# Patient Record
Sex: Female | Born: 1965 | Race: White | Hispanic: No | Smoking: Never smoker
Health system: Southern US, Community
[De-identification: ages and names within clinical notes are randomized; demographics above are authoritative.]

## PROBLEM LIST (undated history)

## (undated) DIAGNOSIS — R7303 Prediabetes: Secondary | ICD-10-CM

## (undated) DIAGNOSIS — K219 Gastro-esophageal reflux disease without esophagitis: Secondary | ICD-10-CM

## (undated) DIAGNOSIS — E7800 Pure hypercholesterolemia, unspecified: Secondary | ICD-10-CM

## (undated) DIAGNOSIS — N879 Dysplasia of cervix uteri, unspecified: Secondary | ICD-10-CM

## (undated) DIAGNOSIS — T8859XA Other complications of anesthesia, initial encounter: Secondary | ICD-10-CM

## (undated) DIAGNOSIS — J189 Pneumonia, unspecified organism: Secondary | ICD-10-CM

## (undated) HISTORY — DX: Gastro-esophageal reflux disease without esophagitis: K21.9

## (undated) HISTORY — PX: ACHILLES TENDON SURGERY: SHX542

## (undated) HISTORY — DX: Dysplasia of cervix uteri, unspecified: N87.9

---

## 2000-05-11 HISTORY — PX: LEEP: SHX91

## 2004-04-24 ENCOUNTER — Ambulatory Visit: Payer: Self-pay | Admitting: Unknown Physician Specialty

## 2005-05-05 ENCOUNTER — Ambulatory Visit: Payer: Self-pay | Admitting: Unknown Physician Specialty

## 2005-10-16 ENCOUNTER — Ambulatory Visit: Payer: Self-pay | Admitting: Orthopaedic Surgery

## 2006-05-06 ENCOUNTER — Ambulatory Visit: Payer: Self-pay | Admitting: Unknown Physician Specialty

## 2008-06-06 ENCOUNTER — Ambulatory Visit: Payer: Self-pay | Admitting: Unknown Physician Specialty

## 2009-06-26 ENCOUNTER — Ambulatory Visit: Payer: Self-pay | Admitting: Unknown Physician Specialty

## 2013-03-08 ENCOUNTER — Ambulatory Visit: Payer: Self-pay | Admitting: Obstetrics and Gynecology

## 2014-12-09 ENCOUNTER — Encounter: Payer: Self-pay | Admitting: Emergency Medicine

## 2014-12-09 ENCOUNTER — Emergency Department
Admission: EM | Admit: 2014-12-09 | Discharge: 2014-12-09 | Disposition: A | Discharge status: Home / Self Care | Payer: PRIVATE HEALTH INSURANCE | Attending: Emergency Medicine | Admitting: Emergency Medicine

## 2014-12-09 ENCOUNTER — Emergency Department: Payer: PRIVATE HEALTH INSURANCE

## 2014-12-09 ENCOUNTER — Other Ambulatory Visit: Payer: Self-pay

## 2014-12-09 DIAGNOSIS — R0789 Other chest pain: Secondary | ICD-10-CM | POA: Insufficient documentation

## 2014-12-09 DIAGNOSIS — R079 Chest pain, unspecified: Secondary | ICD-10-CM | POA: Diagnosis present

## 2014-12-09 HISTORY — DX: Pure hypercholesterolemia, unspecified: E78.00

## 2014-12-09 LAB — CBC
HCT: 41.3 % (ref 35.0–47.0)
Hemoglobin: 14.1 g/dL (ref 12.0–16.0)
MCH: 28.4 pg (ref 26.0–34.0)
MCHC: 34.1 g/dL (ref 32.0–36.0)
MCV: 83.4 fL (ref 80.0–100.0)
PLATELETS: 304 10*3/uL (ref 150–440)
RBC: 4.95 MIL/uL (ref 3.80–5.20)
RDW: 13.5 % (ref 11.5–14.5)
WBC: 8.4 10*3/uL (ref 3.6–11.0)

## 2014-12-09 LAB — COMPREHENSIVE METABOLIC PANEL
ALT: 38 U/L (ref 14–54)
AST: 31 U/L (ref 15–41)
Albumin: 4.1 g/dL (ref 3.5–5.0)
Alkaline Phosphatase: 60 U/L (ref 38–126)
Anion gap: 8 (ref 5–15)
BUN: 16 mg/dL (ref 6–20)
CO2: 27 mmol/L (ref 22–32)
Calcium: 9.7 mg/dL (ref 8.9–10.3)
Chloride: 106 mmol/L (ref 101–111)
Creatinine, Ser: 0.77 mg/dL (ref 0.44–1.00)
GFR calc Af Amer: >60 mL/min (ref >60)
GFR calc non Af Amer: >60 mL/min (ref >60)
GLUCOSE: 114 mg/dL — AB (ref 65–99)
Potassium: 3.8 mmol/L (ref 3.5–5.1)
Sodium: 141 mmol/L (ref 135–145)
Total Bilirubin: 0.5 mg/dL (ref 0.3–1.2)
Total Protein: 7.2 g/dL (ref 6.5–8.1)

## 2014-12-09 LAB — TROPONIN I: Troponin I: <0.03 ng/mL (ref <0.031)

## 2014-12-09 MED ORDER — RANITIDINE HCL 150 MG PO CAPS: 150.0000 mg | ORAL_CAPSULE | Freq: Two times a day (BID) | ORAL | Status: DC

## 2014-12-09 NOTE — ED Notes (Signed)
Patient transported to X-ray 

## 2014-12-09 NOTE — Discharge Instructions (Signed)

## 2014-12-09 NOTE — ED Notes (Signed)
Pt presents to ER alert and anxious. Pt states she worked out this morning and is now having CP that is radiating down her arm. Resp even and unlabored.

## 2014-12-09 NOTE — ED Provider Notes (Signed)
[facility] Emergency Department Provider Note  ____________________________________________  Time seen: 3:40 AM  I have reviewed the triage vital signs and the nursing notes.   HISTORY  Chief Complaint Chest Pain    HPI Mariah Mckay is a 49 y.o. female who complains of very gradual onset of chest pain that started around 10:00 PM last night. Spine constant until now, nonradiating, no diaphoresis nausea vomiting or lightheadedness or syncope. Is not exertional nor pleuritic. Over time has progressed to also having associated right arm paresthesia. The patient just started going to a gym for the first time yesterday morning. Immediately prior to the chest pain starting, she drank a [facility] and felt like she was getting stomach upset and acid reflux and indigestion from it. She tried taking 2 Tums     Past Medical History  Diagnosis Date  . Hypercholesteremia     There are no active problems to display for this patient.   Past Surgical History  Procedure Laterality Date  . Achilles tendon surgery      Current Outpatient Rx  Name  Route  Sig  Dispense  Refill  . ranitidine (ZANTAC) 150 MG capsule   Oral   Take 1 capsule (150 mg total) by mouth 2 (two) times daily.   28 capsule   0     Allergies Review of patient's allergies indicates no known allergies.  History reviewed. No pertinent family history.  Social History History  Substance Use Topics  . Smoking status: Never Smoker   . Smokeless tobacco: Not on file  . Alcohol Use: No    Review of Systems  Constitutional: No fever or chills. No weight changes Eyes:No blurry vision or double vision.  ENT: No sore throat. Cardiovascular: Positive chest pain. Respiratory: No dyspnea or cough. Gastrointestinal: Negative for abdominal pain, vomiting and diarrhea.  No BRBPR or melena. Genitourinary: Negative for dysuria, urinary retention, bloody urine, or difficulty  urinating. Musculoskeletal: Negative for back pain. No joint swelling or pain. Skin: Negative for rash. Neurological: Negative for headaches, focal weakness or numbness. Psychiatric:No anxiety or depression.   Endocrine:No hot/cold intolerance, changes in energy, or sleep difficulty.  10-point ROS otherwise negative.  ____________________________________________   PHYSICAL EXAM:  VITAL SIGNS: ED Triage Vitals  Enc Vitals Group     BP 12/09/14 0339 160/81 mmHg     Pulse Rate 12/09/14 0339 73     Resp 12/09/14 0339 20     Temp 12/09/14 0339 98.1 F (36.7 C)     Temp Source 12/09/14 0339 Oral     SpO2 12/09/14 0339 98 %     Weight --      Height --      Head Cir --      Peak Flow --      Pain Score 12/09/14 0339 7     Pain Loc --      Pain Edu? --      Excl. in GC? --      Constitutional: Alert and oriented. Well appearing and in no distress. Eyes: No scleral icterus. No conjunctival pallor. PERRL. EOMI ENT   Head: Normocephalic and atraumatic.   Nose: No congestion/rhinnorhea. No septal hematoma   Mouth/Throat: MMM, no pharyngeal erythema. No peritonsillar mass. No uvula shift.   Neck: No stridor. No SubQ emphysema. No meningismus. Hematological/Lymphatic/Immunilogical: No cervical lymphadenopathy. Cardiovascular: RRR. Normal and symmetric distal pulses are present in all extremities. No murmurs, rubs, or gallops. Respiratory: Normal respiratory effort without tachypnea  nor retractions. Breath sounds are clear and equal bilaterally. No wheezes/rales/rhonchi. Mild right midsternal chest wall tenderness Gastrointestinal: Soft and nontender. No distention. There is no CVA tenderness.  No rebound, rigidity, or guarding. Genitourinary: deferred Musculoskeletal: Nontender with normal range of motion in all extremities. No joint effusions.  No lower extremity tenderness.  No edema. Neurologic:   Normal speech and language.  CN 2-10 normal. Motor grossly  intact. No pronator drift.  Normal gait. No gross focal neurologic deficits are appreciated.  Skin:  Skin is warm, dry and intact. No rash noted.  No petechiae, purpura, or bullae. Psychiatric: Mood and affect are normal. Speech and behavior are normal. Patient exhibits appropriate insight and judgment.  ____________________________________________    LABS (pertinent positives/negatives) (all labs ordered are listed, but only abnormal results are displayed) Labs Reviewed  COMPREHENSIVE METABOLIC PANEL - Abnormal; Notable for the following:    Glucose, Bld 114 (*)    All other components within normal limits  CBC  TROPONIN I   ____________________________________________   EKG  Interpreted by me Sinus rhythm rate of 72 with 1 PVC on the 10 second strip, normal axis and intervals, normal QRS and ST segments. There are T-wave inversions in V2 and V3.  ____________________________________________    RADIOLOGY  Chest x-ray unremarkable  ____________________________________________   PROCEDURES  ____________________________________________   INITIAL IMPRESSION / ASSESSMENT AND PLAN / ED COURSE  Pertinent labs & imaging results that were available during my care of the patient were reviewed by me and considered in my medical decision making (see chart for details).  Patient well appearing no acute distress. Low suspicion for ACS PE TAD pneumothorax carditis mediastinitis pneumonia or sepsis. By history the patient most likely is having musculoskeletal chest wall pain due to a new exercise regimen or GERD related to [facility] that she drank immediately prior to the symptom onset. After roughly 6 hours of constant symptoms, this is highly unlikely to be cardiac in nature. A single troponin should be adequate to fully evaluate this. If negative, we'll discharge the patient home and have her use a trial of antacids.  ____________________________________________   FINAL  CLINICAL IMPRESSION(S) / ED DIAGNOSES  Final diagnoses:  Other chest pain      Sharman Cheek, MD 12/09/14 [id number]

## 2015-03-29 ENCOUNTER — Ambulatory Visit
Admission: RE | Admit: 2015-03-29 | Discharge: 2015-03-29 | Disposition: A | Discharge status: Home / Self Care | Payer: PRIVATE HEALTH INSURANCE | Source: Ambulatory Visit | Attending: Family Medicine | Admitting: Family Medicine

## 2015-03-29 ENCOUNTER — Other Ambulatory Visit: Payer: Self-pay | Admitting: Family Medicine

## 2015-03-29 DIAGNOSIS — M545 Low back pain: Secondary | ICD-10-CM | POA: Insufficient documentation

## 2015-03-29 DIAGNOSIS — M5136 Other intervertebral disc degeneration, lumbar region: Secondary | ICD-10-CM | POA: Insufficient documentation

## 2015-08-20 ENCOUNTER — Encounter: Payer: Self-pay | Admitting: Obstetrics and Gynecology

## 2015-08-20 ENCOUNTER — Ambulatory Visit (INDEPENDENT_AMBULATORY_CARE_PROVIDER_SITE_OTHER): Payer: Managed Care, Other (non HMO) | Admitting: Obstetrics and Gynecology

## 2015-08-20 VITALS — BP 118/84 | HR 90 | Ht 59.0 in | Wt 189.0 lb

## 2015-08-20 DIAGNOSIS — E669 Obesity, unspecified: Secondary | ICD-10-CM

## 2015-08-20 MED ORDER — PHENTERMINE HCL 37.5 MG PO TABS: 37.5000 mg | ORAL_TABLET | Freq: Every day | ORAL | Status: DC

## 2015-08-20 MED ORDER — CYANOCOBALAMIN 1000 MCG/ML IJ SOLN: 1000.0000 ug | Freq: Once | INTRAMUSCULAR | Status: DC

## 2015-08-20 NOTE — Progress Notes (Signed)
Subjective:  Luna GlasgowRebecca S Kincheloe is a 50 y.o. G0P0000 at Unknown being seen today for weight loss management- initial visit.  Patient reports General ROS: negative and reports previous weight loss attempts:   Risk factors include:  Back pain and sedentary job  Past treatment has included: small frequent feedings, nutritional supplement, vitamin supplement, social assistance, vitamin B-12 injections, appetite stimulant, exercise management and discontinuation of medication.  The following portions of the patient's history were reviewed and updated as appropriate: allergies, current medications, past family history, past medical history, past social history, past surgical history and problem list.   Objective:   Filed Vitals:   08/20/15 0923  BP: 118/84  Pulse: 90  Height: 4\' 11"  (1.499 m)  Weight: 189 lb (85.73 kg)    General:  Alert, oriented and cooperative. Patient is in no acute distress.  :   :   :   :   :   :   PE: Well groomed female in no current distress,   Mental Status: Normal mood and affect. Normal behavior. Normal judgment and thought content.   Current BMI: Body mass index is 38.15 kg/(m^2).   Assessment and Plan:  Obesity  1. Obesity (BMI 30-39.9)   Plan: low carb, High protein diet RX for adipex 37.5 mg daily and B12 1000mcg.ml monthly, to start now with first injection given at today's visit. Reviewed side-effects common to both medications and expected outcomes. Increase daily water intake to at least 8 bottle a day, every day.  Goal is to reduse weight by 10% by end of three months, and will re-evaluate then.  RTC in 4 weeks for Nurse visit to check weight & BP, and get next B12 injections.    Please refer to After Visit Summary for other counseling recommendations.    Shepherd Finnan N Hackney[place]ambley, CNM   Lelend Heinecke NIKE Manami Tutor, CNM      Consider the Low Glycemic Index Diet and 6 smaller meals daily .  This boosts your metabolism and regulates your  sugars:   Use the protein bar by Atkins because they have lots of fiber in them  Find the low carb flatbreads, tortillas and pita breads for sandwiches:  Joseph's makes a pita bread and a flat bread , available at [facility] [employer] and [employer]; Toufayah makes a low carb flatbread available at [employer] and HT that is 9 net carbs and 100 cal Mission makes a low carb whole wheat tortilla available at [employer],and most grocery stores with 6 net carbs and 210 cal  [place] yogurt can still have a lot of carbs .  Dannon Light N fit has 80 cal and 8 carbs

## 2015-09-17 ENCOUNTER — Ambulatory Visit (INDEPENDENT_AMBULATORY_CARE_PROVIDER_SITE_OTHER): Payer: Managed Care, Other (non HMO) | Admitting: Obstetrics and Gynecology

## 2015-09-17 VITALS — BP 118/88 | HR 90 | Ht 59.0 in | Wt 184.3 lb

## 2015-09-17 DIAGNOSIS — E669 Obesity, unspecified: Secondary | ICD-10-CM | POA: Diagnosis not present

## 2015-09-17 MED ORDER — CYANOCOBALAMIN 1000 MCG/ML IJ SOLN
1000.0000 ug | Freq: Once | INTRAMUSCULAR | Status: AC
  Administered 2015-09-17: 1000 ug via INTRAMUSCULAR

## 2015-09-17 NOTE — Progress Notes (Signed)
Patient ID: Mariah Mckay, female   DOB: 1966-04-19, 50 y.o.   MRN: [id number] Pt presents for weight, B/P, B-12 injection. No side effects of medication-Phentermine, or B-12.  Weight loss of  5 lbs. Encouraged eating healthy and exercise.

## 2015-10-18 ENCOUNTER — Ambulatory Visit: Payer: Managed Care, Other (non HMO)

## 2015-10-18 ENCOUNTER — Ambulatory Visit (INDEPENDENT_AMBULATORY_CARE_PROVIDER_SITE_OTHER): Payer: Managed Care, Other (non HMO) | Admitting: Obstetrics and Gynecology

## 2015-10-18 VITALS — BP 121/79 | HR 103 | Ht 59.0 in | Wt 183.2 lb

## 2015-10-18 DIAGNOSIS — R5383 Other fatigue: Secondary | ICD-10-CM | POA: Diagnosis not present

## 2015-10-18 DIAGNOSIS — E669 Obesity, unspecified: Secondary | ICD-10-CM | POA: Diagnosis not present

## 2015-10-18 MED ORDER — CYANOCOBALAMIN 1000 MCG/ML IJ SOLN
1000.0000 ug | Freq: Once | INTRAMUSCULAR | Status: AC
  Administered 2015-10-18: 1000 ug via INTRAMUSCULAR

## 2015-10-18 NOTE — Progress Notes (Signed)
Pt presents for weight, B/P, B-12 injection. No side effects of medication-Phentermine, or B-12.  Weight loss of 1 lbs. Encouraged eating healthy and exercise. Pt to follow up in August for reevaluation of weight management.

## 2015-10-18 NOTE — Patient Instructions (Signed)
Follow up in August for weight management eval

## 2015-12-20 ENCOUNTER — Encounter: Payer: Self-pay | Admitting: Obstetrics and Gynecology

## 2015-12-20 ENCOUNTER — Ambulatory Visit (INDEPENDENT_AMBULATORY_CARE_PROVIDER_SITE_OTHER): Payer: Managed Care, Other (non HMO) | Admitting: Obstetrics and Gynecology

## 2015-12-20 VITALS — BP 119/68 | HR 88 | Ht <= 58 in | Wt 187.6 lb

## 2015-12-20 DIAGNOSIS — E669 Obesity, unspecified: Secondary | ICD-10-CM | POA: Diagnosis not present

## 2015-12-20 MED ORDER — PHENTERMINE HCL 37.5 MG PO TABS: 37.5000 mg | ORAL_TABLET | Freq: Every day | ORAL | 2 refills | Status: DC

## 2015-12-20 NOTE — Progress Notes (Signed)
SUBJECTIVE:  50 y.o. here for follow-up weight loss visit, previously seen 4 weeks ago. Denies any concerns and feels like medication worked well, but didn't exercise as planned, desires another trial and will exercise as planned.  OBJECTIVE:  BP 119/68   Pulse 88   Ht 4\' 10"  (1.473 m)   Wt 187 lb 9.6 oz (85.1 kg)   BMI 39.21 kg/m   Body mass index is 39.21 kg/m. Patient appears well. ASSESSMENT:  Obesity- responding well to weight loss plan PLAN:  To continue with current medications. B12 102900mcg/ml injection given RTC in 4 weeks as planned  Melody [place] Corners[place]ambley, CNM

## 2016-01-07 ENCOUNTER — Ambulatory Visit: Payer: Self-pay | Admitting: Physician Assistant

## 2016-01-07 ENCOUNTER — Encounter: Payer: Self-pay | Admitting: Physician Assistant

## 2016-01-07 VITALS — BP 130/80 | HR 116 | Temp 99.5°F

## 2016-01-07 DIAGNOSIS — R509 Fever, unspecified: Secondary | ICD-10-CM

## 2016-01-07 DIAGNOSIS — N390 Urinary tract infection, site not specified: Secondary | ICD-10-CM

## 2016-01-07 LAB — POCT URINALYSIS DIPSTICK
Bilirubin, UA: NEGATIVE
Glucose, UA: NEGATIVE
Ketones, UA: NEGATIVE
Nitrite, UA: NEGATIVE
PH UA: 7
PROTEIN UA: NEGATIVE
SPEC GRAV UA: 1.015
UROBILINOGEN UA: 0.2

## 2016-01-07 NOTE — Progress Notes (Signed)
S: c/o swollen nodes behind her r ear and going down the back of her neck, states she had a headache on Thurs, but it went away by Friday, was on vacation, then noticed today she is having chills, nodes are more swollen, had a tick bite in April but none since then, no cough, congestion, v/d  O: vitals w low grade temp of 99.5, ENT wnl except for swollen posterior auricular nodes and swollen posterior cervical nodes, no lesions noted on scalp, throat wnl, neck supple no lymph, lungs c t a, cv rrr, ua +trace leuks  A: uti, lymphadenopathy  P: septra ds 1 po bid x 7d, if nodes not better in 1 week then will do labs and refer to ENT

## 2016-01-08 ENCOUNTER — Other Ambulatory Visit: Payer: Self-pay | Admitting: Physician Assistant

## 2016-01-08 MED ORDER — SULFAMETHOXAZOLE-TRIMETHOPRIM 800-160 MG PO TABS: 1.0000 | ORAL_TABLET | Freq: Two times a day (BID) | ORAL | 0 refills | Status: DC

## 2016-01-08 NOTE — Progress Notes (Signed)
S:  C/o uti sx for 2 days, burning, urgency, frequency, denies vaginal discharge, abdominal pain or flank pain:  Remainder ros neg  O:  Vitals wnl, nad, no cva tenderness, back nontender, lungs c t a,cv rrr, abd soft nontender, bs normal, n/v intact  A: uti  P: septra ds, increase water intake, add cranberry juice, return if not improving in 2 -3 days, return earlier if worsening, discussed pyelonephritis sx

## 2016-01-09 LAB — URINE CULTURE

## 2016-01-17 ENCOUNTER — Ambulatory Visit (INDEPENDENT_AMBULATORY_CARE_PROVIDER_SITE_OTHER): Payer: Managed Care, Other (non HMO) | Admitting: Obstetrics and Gynecology

## 2016-01-17 VITALS — BP 117/80 | HR 79 | Wt 177.0 lb

## 2016-01-17 DIAGNOSIS — E669 Obesity, unspecified: Secondary | ICD-10-CM

## 2016-01-17 NOTE — Progress Notes (Signed)
Patient ID: Mariah GlasgowRebecca S Bouldin, female   DOB: [birth year], 50 y.o.   MRN: [id number] Pt presents for weight, B/P, B-12 injection. No side effects of B-12 medication.  Weight loss of 10 lbs. Encouraged eating healthy and exercise.  Pt states phentermine keeps her awake at night, had headaches, and not eating enough. Has not taken the last couple of days and headache has gone away. I had suggested trying taking 1/2 of pill. Pt also states she is being charged for visits when coming for her weight management, $60.00 and I did tell pt that is the injection fee.  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 B-12 injection not given. I did tell her she needed her weight, b/p checked on monthly basis. Pt is exercising.  Please let me know if the 1/2 phentermine and the injection given by a friend. I told her I would call her if it wasn't.

## 2016-02-12 ENCOUNTER — Ambulatory Visit: Payer: Managed Care, Other (non HMO)

## 2016-03-18 ENCOUNTER — Encounter: Payer: Self-pay | Admitting: *Deleted

## 2016-03-20 ENCOUNTER — Other Ambulatory Visit: Payer: Self-pay | Admitting: Obstetrics and Gynecology

## 2016-03-20 ENCOUNTER — Ambulatory Visit (INDEPENDENT_AMBULATORY_CARE_PROVIDER_SITE_OTHER): Payer: Managed Care, Other (non HMO) | Admitting: Obstetrics and Gynecology

## 2016-03-20 ENCOUNTER — Encounter: Payer: Self-pay | Admitting: Obstetrics and Gynecology

## 2016-03-20 VITALS — BP 138/76 | HR 86 | Ht <= 58 in | Wt 180.8 lb

## 2016-03-20 DIAGNOSIS — Z01419 Encounter for gynecological examination (general) (routine) without abnormal findings: Secondary | ICD-10-CM

## 2016-03-20 DIAGNOSIS — E7800 Pure hypercholesterolemia, unspecified: Secondary | ICD-10-CM | POA: Insufficient documentation

## 2016-03-20 DIAGNOSIS — N952 Postmenopausal atrophic vaginitis: Secondary | ICD-10-CM | POA: Diagnosis not present

## 2016-03-20 DIAGNOSIS — R7303 Prediabetes: Secondary | ICD-10-CM | POA: Insufficient documentation

## 2016-03-20 DIAGNOSIS — E1169 Type 2 diabetes mellitus with other specified complication: Secondary | ICD-10-CM | POA: Insufficient documentation

## 2016-03-20 DIAGNOSIS — R58 Hemorrhage, not elsewhere classified: Secondary | ICD-10-CM | POA: Diagnosis not present

## 2016-03-20 MED ORDER — HYDROCORTISONE ACE-PRAMOXINE 1-1 % RE FOAM: 1.0000 | Freq: Two times a day (BID) | RECTAL | 2 refills | Status: DC

## 2016-03-20 MED ORDER — ESTRADIOL 0.1 MG/GM VA CREA: 2.0000 g | TOPICAL_CREAM | Freq: Every day | VAGINAL | 2 refills | Status: DC

## 2016-03-20 NOTE — Progress Notes (Signed)
Subjective:   Mariah Mckay is a 50 y.o. G0P0000 Caucasian female here for a routine well-woman exam.  No LMP recorded. Patient is postmenopausal.    Current complaints: pain with sex for last 6 months, burning with penetration PCP: [facility]       does desire labs, had labs obtained in Sept, and needs another draw,   Social History: Sexual: heterosexual Marital Status: divorced Living situation: alone Occupation: unknown occupation Tobacco/alcohol: no tobacco use Illicit drugs: no history of illicit drug use  The following portions of the patient's history were reviewed and updated as appropriate: allergies, current medications, past family history, past medical history, past social history, past surgical history and problem list.  Past Medical History Past Medical History:  Diagnosis Date  . Cervical dysplasia   . GERD (gastroesophageal reflux disease)   . Hypercholesteremia     Past Surgical History Past Surgical History:  Procedure Laterality Date  . ACHILLES TENDON SURGERY    . LEEP      Gynecologic History G0P0000  No LMP recorded. Patient is postmenopausal. Contraception: post menopausal status Last Pap: 2014. Results were: normal Last mammogram: 2014. Results were: normal   Obstetric History OB History  Gravida Para Term Preterm AB Living  0 0 0 0 0 0  SAB TAB Ectopic Multiple Live Births  0 0 0 0          Current Medications Current Outpatient Prescriptions on File Prior to Visit  Medication Sig Dispense Refill  . cyanocobalamin (,VITAMIN B-12,) 1000 MCG/ML injection Inject 1 mL (1,000 mcg total) into the muscle once. (Patient not taking: Reported on 03/20/2016) 10 mL 1  . phentermine (ADIPEX-P) 37.5 MG tablet Take 1 tablet (37.5 mg total) by mouth daily before breakfast. (Patient not taking: Reported on 03/20/2016) 30 tablet 2  . ranitidine (ZANTAC) 150 MG capsule Take 1 capsule (150 mg total) by mouth 2 (two) times daily. (Patient not taking: Reported  on 03/20/2016) 28 capsule 0  . sulfamethoxazole-trimethoprim (BACTRIM DS,SEPTRA DS) 800-160 MG tablet Take 1 tablet by mouth 2 (two) times daily. (Patient not taking: Reported on 03/20/2016) 14 tablet 0   No current facility-administered medications on file prior to visit.     Review of Systems Patient denies any headaches, blurred vision, shortness of breath, chest pain, abdominal pain, problems with bowel movements, urination, or intercourse.  Objective:  BP 138/76   Pulse 86   Ht 4\' 10"  (1.473 m)   Wt 180 lb 12.8 oz (82 kg)   BMI 37.79 kg/m  Physical Exam  General:  Well developed, well nourished, no acute distress. She is alert and oriented x3. Skin:  Warm and dry Neck:  Midline trachea, no thyromegaly or nodules Cardiovascular: Regular rate and rhythm, no murmur heard Lungs:  Effort normal, all lung fields clear to auscultation bilaterally Breasts:  No dominant palpable mass, retraction, or nipple discharge Abdomen:  Soft, non tender, no hepatosplenomegaly or masses Pelvic:  External genitalia is normal in appearance.  The vagina is atrophic. The cervix is bulbous, no CMT.  Thin prep pap is done with HR HPV cotesting. Uterus is felt to be normal size, shape, and contour.  No adnexal masses or tenderness noted. Extremities:  No swelling or varicosities noted Psych:  She has a normal mood and affect  Assessment:   Healthy well-woman exam Vaginal atrophy Internal hemorrhoid Obesity   Plan:  Labs repeated. Declined flu vaccine F/U 1 year  for AE, or sooner if needed Mammogram ordered or  sooner if problems Colonoscopy indicated  Sheryl Saintil Suzan NailerN Joceline Hinchcliff, CNM

## 2016-03-20 NOTE — Patient Instructions (Signed)
Preventive Care for Adults, Female A healthy lifestyle and preventive care can promote health and wellness. Preventive health guidelines for women include the following key practices.  A routine yearly physical is a good way to check with your health care provider about your health and preventive screening. It is a chance to share any concerns and updates on your health and to receive a thorough exam.  Visit your dentist for a routine exam and preventive care every 6 months. Brush your teeth twice a day and floss once a day. Good oral hygiene prevents tooth decay and gum disease.  The frequency of eye exams is based on your age, health, family medical history, use of contact lenses, and other factors. Follow your health care provider's recommendations for frequency of eye exams.  Eat a healthy diet. Foods like vegetables, fruits, whole grains, low-fat dairy products, and lean protein foods contain the nutrients you need without too many calories. Decrease your intake of foods high in solid fats, added sugars, and salt. Eat the right amount of calories for you.Get information about a proper diet from your health care provider, if necessary.  Regular physical exercise is one of the most important things you can do for your health. Most adults should get at least 150 minutes of moderate-intensity exercise (any activity that increases your heart rate and causes you to sweat) each week. In addition, most adults need muscle-strengthening exercises on 2 or more days a week.  Maintain a healthy weight. The body mass index (BMI) is a screening tool to identify possible weight problems. It provides an estimate of body fat based on height and weight. Your health care provider can find your BMI and can help you achieve or maintain a healthy weight.For adults 20 years and older:  A BMI below 18.5 is considered underweight.  A BMI of 18.5 to 24.9 is normal.  A BMI of 25 to 29.9 is considered  overweight.  A BMI of 30 and above is considered obese.  Maintain normal blood lipids and cholesterol levels by exercising and minimizing your intake of saturated fat. Eat a balanced diet with plenty of fruit and vegetables. Blood tests for lipids and cholesterol should begin at age 58 and be repeated every 5 years. If your lipid or cholesterol levels are high, you are over 50, or you are at high risk for heart disease, you may need your cholesterol levels checked more frequently.Ongoing high lipid and cholesterol levels should be treated with medicines if diet and exercise are not working.  If you smoke, find out from your health care provider how to quit. If you do not use tobacco, do not start.  Lung cancer screening is recommended for adults aged 32-80 years who are at high risk for developing lung cancer because of a history of smoking. A yearly low-dose CT scan of the lungs is recommended for people who have at least a 30-pack-year history of smoking and are a current smoker or have quit within the past 15 years. A pack year of smoking is smoking an average of 1 pack of cigarettes a day for 1 year (for example: 1 pack a day for 30 years or 2 packs a day for 15 years). Yearly screening should continue until the smoker has stopped smoking for at least 15 years. Yearly screening should be stopped for people who develop a health problem that would prevent them from having lung cancer treatment.  If you are pregnant, do not drink alcohol. If you are  breastfeeding, be very cautious about drinking alcohol. If you are not pregnant and choose to drink alcohol, do not have more than 1 drink per day. One drink is considered to be 12 ounces (355 mL) of beer, 5 ounces (148 mL) of wine, or 1.5 ounces (44 mL) of liquor.  Avoid use of street drugs. Do not share needles with anyone. Ask for help if you need support or instructions about stopping the use of drugs.  High blood pressure causes heart disease and  increases the risk of stroke. Your blood pressure should be checked at least every 1 to 2 years. Ongoing high blood pressure should be treated with medicines if weight loss and exercise do not work.  If you are 25-78 years old, ask your health care provider if you should take aspirin to prevent strokes.  Diabetes screening is done by taking a blood sample to check your blood glucose level after you have not eaten for a certain period of time (fasting). If you are not overweight and you do not have risk factors for diabetes, you should be screened once every 3 years starting at age 86. If you are overweight or obese and you are 3-87 years of age, you should be screened for diabetes every year as part of your cardiovascular risk assessment.  Breast cancer screening is essential preventive care for women. You should practice "breast self-awareness." This means understanding the normal appearance and feel of your breasts and may include breast self-examination. Any changes detected, no matter how small, should be reported to a health care provider. Women in their 66s and 30s should have a clinical breast exam (CBE) by a health care provider as part of a regular health exam every 1 to 3 years. After age 43, women should have a CBE every year. Starting at age 37, women should consider having a mammogram (breast X-ray test) every year. Women who have a family history of breast cancer should talk to their health care provider about genetic screening. Women at a high risk of breast cancer should talk to their health care providers about having an MRI and a mammogram every year.  Breast cancer gene (BRCA)-related cancer risk assessment is recommended for women who have family members with BRCA-related cancers. BRCA-related cancers include breast, ovarian, tubal, and peritoneal cancers. Having family members with these cancers may be associated with an increased risk for harmful changes (mutations) in the breast  cancer genes BRCA1 and BRCA2. Results of the assessment will determine the need for genetic counseling and BRCA1 and BRCA2 testing.  Your health care provider may recommend that you be screened regularly for cancer of the pelvic organs (ovaries, uterus, and vagina). This screening involves a pelvic examination, including checking for microscopic changes to the surface of your cervix (Pap test). You may be encouraged to have this screening done every 3 years, beginning at age 78.  For women ages 79-65, health care providers may recommend pelvic exams and Pap testing every 3 years, or they may recommend the Pap and pelvic exam, combined with testing for human papilloma virus (HPV), every 5 years. Some types of HPV increase your risk of cervical cancer. Testing for HPV may also be done on women of any age with unclear Pap test results.  Other health care providers may not recommend any screening for nonpregnant women who are considered low risk for pelvic cancer and who do not have symptoms. Ask your health care provider if a screening pelvic exam is right for  you.  If you have had past treatment for cervical cancer or a condition that could lead to cancer, you need Pap tests and screening for cancer for at least 20 years after your treatment. If Pap tests have been discontinued, your risk factors (such as having a new sexual partner) need to be reassessed to determine if screening should resume. Some women have medical problems that increase the chance of getting cervical cancer. In these cases, your health care provider may recommend more frequent screening and Pap tests.  Colorectal cancer can be detected and often prevented. Most routine colorectal cancer screening begins at the age of 2 years and continues through age 13 years. However, your health care provider may recommend screening at an earlier age if you have risk factors for colon cancer. On a yearly basis, your health care provider may provide  home test kits to check for hidden blood in the stool. Use of a small camera at the end of a tube, to directly examine the colon (sigmoidoscopy or colonoscopy), can detect the earliest forms of colorectal cancer. Talk to your health care provider about this at age 36, when routine screening begins. Direct exam of the colon should be repeated every 5-10 years through age 41 years, unless early forms of precancerous polyps or small growths are found.  People who are at an increased risk for hepatitis B should be screened for this virus. You are considered at high risk for hepatitis B if:  You were born in a country where hepatitis B occurs often. Talk with your health care provider about which countries are considered high risk.  Your parents were born in a high-risk country and you have not received a shot to protect against hepatitis B (hepatitis B vaccine).  You have HIV or AIDS.  You use needles to inject street drugs.  You live with, or have sex with, someone who has hepatitis B.  You get hemodialysis treatment.  You take certain medicines for conditions like cancer, organ transplantation, and autoimmune conditions.  Hepatitis C blood testing is recommended for all people born from 1 through 1965 and any individual with known risks for hepatitis C.  Practice safe sex. Use condoms and avoid high-risk sexual practices to reduce the spread of sexually transmitted infections (STIs). STIs include gonorrhea, chlamydia, syphilis, trichomonas, herpes, HPV, and human immunodeficiency virus (HIV). Herpes, HIV, and HPV are viral illnesses that have no cure. They can result in disability, cancer, and death.  You should be screened for sexually transmitted illnesses (STIs) including gonorrhea and chlamydia if:  You are sexually active and are younger than 24 years.  You are older than 24 years and your health care provider tells you that you are at risk for this type of infection.  Your sexual  activity has changed since you were last screened and you are at an increased risk for chlamydia or gonorrhea. Ask your health care provider if you are at risk.  If you are at risk of being infected with HIV, it is recommended that you take a prescription medicine daily to prevent HIV infection. This is called preexposure prophylaxis (PrEP). You are considered at risk if:  You are sexually active and do not regularly use condoms or know the HIV status of your partner(s).  You take drugs by injection.  You are sexually active with a partner who has HIV.  Talk with your health care provider about whether you are at high risk of being infected with HIV. If  you choose to begin PrEP, you should first be tested for HIV. You should then be tested every 3 months for as long as you are taking PrEP.  Osteoporosis is a disease in which the bones lose minerals and strength with aging. This can result in serious bone fractures or breaks. The risk of osteoporosis can be identified using a bone density scan. Women ages 1 years and over and women at risk for fractures or osteoporosis should discuss screening with their health care providers. Ask your health care provider whether you should take a calcium supplement or vitamin D to reduce the rate of osteoporosis.  Menopause can be associated with physical symptoms and risks. Hormone replacement therapy is available to decrease symptoms and risks. You should talk to your health care provider about whether hormone replacement therapy is right for you.  Use sunscreen. Apply sunscreen liberally and repeatedly throughout the day. You should seek shade when your shadow is shorter than you. Protect yourself by wearing long sleeves, pants, a wide-brimmed hat, and sunglasses year round, whenever you are outdoors.  Once a month, do a whole body skin exam, using a mirror to look at the skin on your back. Tell your health care provider of new moles, moles that have irregular  borders, moles that are larger than a pencil eraser, or moles that have changed in shape or color.  Stay current with required vaccines (immunizations).  Influenza vaccine. All adults should be immunized every year.  Tetanus, diphtheria, and acellular pertussis (Td, Tdap) vaccine. Pregnant women should receive 1 dose of Tdap vaccine during each pregnancy. The dose should be obtained regardless of the length of time since the last dose. Immunization is preferred during the 27th-36th week of gestation. An adult who has not previously received Tdap or who does not know her vaccine status should receive 1 dose of Tdap. This initial dose should be followed by tetanus and diphtheria toxoids (Td) booster doses every 10 years. Adults with an unknown or incomplete history of completing a 3-dose immunization series with Td-containing vaccines should begin or complete a primary immunization series including a Tdap dose. Adults should receive a Td booster every 10 years.  Varicella vaccine. An adult without evidence of immunity to varicella should receive 2 doses or a second dose if she has previously received 1 dose. Pregnant females who do not have evidence of immunity should receive the first dose after pregnancy. This first dose should be obtained before leaving the health care facility. The second dose should be obtained 4-8 weeks after the first dose.  Human papillomavirus (HPV) vaccine. Females aged 13-26 years who have not received the vaccine previously should obtain the 3-dose series. The vaccine is not recommended for use in pregnant females. However, pregnancy testing is not needed before receiving a dose. If a female is found to be pregnant after receiving a dose, no treatment is needed. In that case, the remaining doses should be delayed until after the pregnancy. Immunization is recommended for any person with an immunocompromised condition through the age of 24 years if she did not get any or all doses  earlier. During the 3-dose series, the second dose should be obtained 4-8 weeks after the first dose. The third dose should be obtained 24 weeks after the first dose and 16 weeks after the second dose.  Zoster vaccine. One dose is recommended for adults aged 97 years or older unless certain conditions are present.  Measles, mumps, and rubella (MMR) vaccine. Adults born  before 1957 generally are considered immune to measles and mumps. Adults born in [birth year] or later should have 1 or more doses of MMR vaccine unless there is a contraindication to the vaccine or there is laboratory evidence of immunity to each of the three diseases. A routine second dose of MMR vaccine should be obtained at least 28 days after the first dose for students attending postsecondary schools, health care workers, or international travelers. People who received inactivated measles vaccine or an unknown type of measles vaccine during 1963-1967 should receive 2 doses of MMR vaccine. People who received inactivated mumps vaccine or an unknown type of mumps vaccine before 1979 and are at high risk for mumps infection should consider immunization with 2 doses of MMR vaccine. For females of childbearing age, rubella immunity should be determined. If there is no evidence of immunity, females who are not pregnant should be vaccinated. If there is no evidence of immunity, females who are pregnant should delay immunization until after pregnancy. Unvaccinated health care workers born before [birth year] who lack laboratory evidence of measles, mumps, or rubella immunity or laboratory confirmation of disease should consider measles and mumps immunization with 2 doses of MMR vaccine or rubella immunization with 1 dose of MMR vaccine.  Pneumococcal 13-valent conjugate (PCV13) vaccine. When indicated, a person who is uncertain of his immunization history and has no record of immunization should receive the PCV13 vaccine. All adults 61 years of age and older  should receive this vaccine. An adult aged 92 years or older who has certain medical conditions and has not been previously immunized should receive 1 dose of PCV13 vaccine. This PCV13 should be followed with a dose of pneumococcal polysaccharide (PPSV23) vaccine. Adults who are at high risk for pneumococcal disease should obtain the PPSV23 vaccine at least 8 weeks after the dose of PCV13 vaccine. Adults older than 50 years of age who have normal immune system function should obtain the PPSV23 vaccine dose at least 1 year after the dose of PCV13 vaccine.  Pneumococcal polysaccharide (PPSV23) vaccine. When PCV13 is also indicated, PCV13 should be obtained first. All adults aged 2 years and older should be immunized. An adult younger than age 30 years who has certain medical conditions should be immunized. Any person who resides in a nursing home or long-term care facility should be immunized. An adult smoker should be immunized. People with an immunocompromised condition and certain other conditions should receive both PCV13 and PPSV23 vaccines. People with human immunodeficiency virus (HIV) infection should be immunized as soon as possible after diagnosis. Immunization during chemotherapy or radiation therapy should be avoided. Routine use of PPSV23 vaccine is not recommended for American Indians, [place] Natives, or people younger than 65 years unless there are medical conditions that require PPSV23 vaccine. When indicated, people who have unknown immunization and have no record of immunization should receive PPSV23 vaccine. One-time revaccination 5 years after the first dose of PPSV23 is recommended for people aged 19-64 years who have chronic kidney failure, nephrotic syndrome, asplenia, or immunocompromised conditions. People who received 1-2 doses of PPSV23 before age 44 years should receive another dose of PPSV23 vaccine at age 83 years or later if at least 5 years have passed since the previous dose. Doses  of PPSV23 are not needed for people immunized with PPSV23 at or after age 20 years.  Meningococcal vaccine. Adults with asplenia or persistent complement component deficiencies should receive 2 doses of quadrivalent meningococcal conjugate (MenACWY-D) vaccine. The doses should be obtained  at least 2 months apart. Microbiologists working with certain meningococcal bacteria, [employer] recruits, people at risk during an outbreak, and people who travel to or live in countries with a high rate of meningitis should be immunized. A first-year college student up through age 68 years who is living in a residence hall should receive a dose if she did not receive a dose on or after her 16th birthday. Adults who have certain high-risk conditions should receive one or more doses of vaccine.  Hepatitis A vaccine. Adults who wish to be protected from this disease, have certain high-risk conditions, work with hepatitis A-infected animals, work in hepatitis A research labs, or travel to or work in countries with a high rate of hepatitis A should be immunized. Adults who were previously unvaccinated and who anticipate close contact with an international adoptee during the first 60 days after arrival in the [place] from a country with a high rate of hepatitis A should be immunized.  Hepatitis B vaccine. Adults who wish to be protected from this disease, have certain high-risk conditions, may be exposed to blood or other infectious body fluids, are household contacts or sex partners of hepatitis B positive people, are clients or workers in certain care facilities, or travel to or work in countries with a high rate of hepatitis B should be immunized.  Haemophilus influenzae type b (Hib) vaccine. A previously unvaccinated person with asplenia or sickle cell disease or having a scheduled splenectomy should receive 1 dose of Hib vaccine. Regardless of previous immunization, a recipient of a hematopoietic stem cell transplant  should receive a 3-dose series 6-12 months after her successful transplant. Hib vaccine is not recommended for adults with HIV infection. Preventive Services / Frequency Ages 66 to 56 years  Blood pressure check.** / Every 3-5 years.  Lipid and cholesterol check.** / Every 5 years beginning at age 36.  Clinical breast exam.** / Every 3 years for women in their 34s and 75s.  BRCA-related cancer risk assessment.** / For women who have family members with a BRCA-related cancer (breast, ovarian, tubal, or peritoneal cancers).  Pap test.** / Every 2 years from ages 104 through 52. Every 3 years starting at age 52 through age 31 or 74 with a history of 3 consecutive normal Pap tests.  HPV screening.** / Every 3 years from ages 21 through ages 59 to 60 with a history of 3 consecutive normal Pap tests.  Hepatitis C blood test.** / For any individual with known risks for hepatitis C.  Skin self-exam. / Monthly.  Influenza vaccine. / Every year.  Tetanus, diphtheria, and acellular pertussis (Tdap, Td) vaccine.** / Consult your health care provider. Pregnant women should receive 1 dose of Tdap vaccine during each pregnancy. 1 dose of Td every 10 years.  Varicella vaccine.** / Consult your health care provider. Pregnant females who do not have evidence of immunity should receive the first dose after pregnancy.  HPV vaccine. / 3 doses over 6 months, if 88 and younger. The vaccine is not recommended for use in pregnant females. However, pregnancy testing is not needed before receiving a dose.  Measles, mumps, rubella (MMR) vaccine.** / You need at least 1 dose of MMR if you were born in 1957 or later. You may also need a 2nd dose. For females of childbearing age, rubella immunity should be determined. If there is no evidence of immunity, females who are not pregnant should be vaccinated. If there is no evidence of immunity, females who are  pregnant should delay immunization until after  pregnancy.  Pneumococcal 13-valent conjugate (PCV13) vaccine.** / Consult your health care provider.  Pneumococcal polysaccharide (PPSV23) vaccine.** / 1 to 2 doses if you smoke cigarettes or if you have certain conditions.  Meningococcal vaccine.** / 1 dose if you are age 87 to 44 years and a Market researcher living in a residence hall, or have one of several medical conditions, you need to get vaccinated against meningococcal disease. You may also need additional booster doses.  Hepatitis A vaccine.** / Consult your health care provider.  Hepatitis B vaccine.** / Consult your health care provider.  Haemophilus influenzae type b (Hib) vaccine.** / Consult your health care provider. Ages 86 to 38 years  Blood pressure check.** / Every year.  Lipid and cholesterol check.** / Every 5 years beginning at age 49 years.  Lung cancer screening. / Every year if you are aged 71-80 years and have a 30-pack-year history of smoking and currently smoke or have quit within the past 15 years. Yearly screening is stopped once you have quit smoking for at least 15 years or develop a health problem that would prevent you from having lung cancer treatment.  Clinical breast exam.** / Every year after age 51 years.  BRCA-related cancer risk assessment.** / For women who have family members with a BRCA-related cancer (breast, ovarian, tubal, or peritoneal cancers).  Mammogram.** / Every year beginning at age 18 years and continuing for as long as you are in good health. Consult with your health care provider.  Pap test.** / Every 3 years starting at age 63 years through age 37 or 57 years with a history of 3 consecutive normal Pap tests.  HPV screening.** / Every 3 years from ages 41 years through ages 76 to 23 years with a history of 3 consecutive normal Pap tests.  Fecal occult blood test (FOBT) of stool. / Every year beginning at age 36 years and continuing until age 51 years. You may not need  to do this test if you get a colonoscopy every 10 years.  Flexible sigmoidoscopy or colonoscopy.** / Every 5 years for a flexible sigmoidoscopy or every 10 years for a colonoscopy beginning at age 36 years and continuing until age 35 years.  Hepatitis C blood test.** / For all people born from [birth year] through 1965 and any individual with known risks for hepatitis C.  Skin self-exam. / Monthly.  Influenza vaccine. / Every year.  Tetanus, diphtheria, and acellular pertussis (Tdap/Td) vaccine.** / Consult your health care provider. Pregnant women should receive 1 dose of Tdap vaccine during each pregnancy. 1 dose of Td every 10 years.  Varicella vaccine.** / Consult your health care provider. Pregnant females who do not have evidence of immunity should receive the first dose after pregnancy.  Zoster vaccine.** / 1 dose for adults aged 73 years or older.  Measles, mumps, rubella (MMR) vaccine.** / You need at least 1 dose of MMR if you were born in 1957 or later. You may also need a second dose. For females of childbearing age, rubella immunity should be determined. If there is no evidence of immunity, females who are not pregnant should be vaccinated. If there is no evidence of immunity, females who are pregnant should delay immunization until after pregnancy.  Pneumococcal 13-valent conjugate (PCV13) vaccine.** / Consult your health care provider.  Pneumococcal polysaccharide (PPSV23) vaccine.** / 1 to 2 doses if you smoke cigarettes or if you have certain conditions.  Meningococcal vaccine.** /  Consult your health care provider.  Hepatitis A vaccine.** / Consult your health care provider.  Hepatitis B vaccine.** / Consult your health care provider.  Haemophilus influenzae type b (Hib) vaccine.** / Consult your health care provider. Ages 80 years and over  Blood pressure check.** / Every year.  Lipid and cholesterol check.** / Every 5 years beginning at age 62 years.  Lung cancer  screening. / Every year if you are aged 32-80 years and have a 30-pack-year history of smoking and currently smoke or have quit within the past 15 years. Yearly screening is stopped once you have quit smoking for at least 15 years or develop a health problem that would prevent you from having lung cancer treatment.  Clinical breast exam.** / Every year after age 61 years.  BRCA-related cancer risk assessment.** / For women who have family members with a BRCA-related cancer (breast, ovarian, tubal, or peritoneal cancers).  Mammogram.** / Every year beginning at age 39 years and continuing for as long as you are in good health. Consult with your health care provider.  Pap test.** / Every 3 years starting at age 85 years through age 74 or 72 years with 3 consecutive normal Pap tests. Testing can be stopped between 65 and 70 years with 3 consecutive normal Pap tests and no abnormal Pap or HPV tests in the past 10 years.  HPV screening.** / Every 3 years from ages 55 years through ages 67 or 77 years with a history of 3 consecutive normal Pap tests. Testing can be stopped between 65 and 70 years with 3 consecutive normal Pap tests and no abnormal Pap or HPV tests in the past 10 years.  Fecal occult blood test (FOBT) of stool. / Every year beginning at age 81 years and continuing until age 22 years. You may not need to do this test if you get a colonoscopy every 10 years.  Flexible sigmoidoscopy or colonoscopy.** / Every 5 years for a flexible sigmoidoscopy or every 10 years for a colonoscopy beginning at age 67 years and continuing until age 22 years.  Hepatitis C blood test.** / For all people born from [birth year] through 1965 and any individual with known risks for hepatitis C.  Osteoporosis screening.** / A one-time screening for women ages 8 years and over and women at risk for fractures or osteoporosis.  Skin self-exam. / Monthly.  Influenza vaccine. / Every year.  Tetanus, diphtheria, and  acellular pertussis (Tdap/Td) vaccine.** / 1 dose of Td every 10 years.  Varicella vaccine.** / Consult your health care provider.  Zoster vaccine.** / 1 dose for adults aged 56 years or older.  Pneumococcal 13-valent conjugate (PCV13) vaccine.** / Consult your health care provider.  Pneumococcal polysaccharide (PPSV23) vaccine.** / 1 dose for all adults aged 15 years and older.  Meningococcal vaccine.** / Consult your health care provider.  Hepatitis A vaccine.** / Consult your health care provider.  Hepatitis B vaccine.** / Consult your health care provider.  Haemophilus influenzae type b (Hib) vaccine.** / Consult your health care provider. ** Family history and personal history of risk and conditions may change your health care provider's recommendations.   This information is not intended to replace advice given to you by your health care provider. Make sure you discuss any questions you have with your health care provider.   Document Released: 06/23/2001 Document Revised: 05/18/2014 Document Reviewed: 09/22/2010 Elsevier Interactive Patient Education [employer].

## 2016-03-23 ENCOUNTER — Encounter: Payer: Self-pay | Admitting: Endocrinology

## 2016-03-23 LAB — COMPREHENSIVE METABOLIC PANEL
ALT: 21 IU/L (ref 0–32)
AST: 17 IU/L (ref 0–40)
Albumin/Globulin Ratio: 1.8 (ref 1.2–2.2)
Albumin: 4.6 g/dL (ref 3.5–5.5)
Alkaline Phosphatase: 63 IU/L (ref 39–117)
BUN/Creatinine Ratio: 16 (ref 9–23)
BUN: 13 mg/dL (ref 6–24)
Bilirubin Total: 0.7 mg/dL (ref 0.0–1.2)
CO2: 27 mmol/L (ref 18–29)
CREATININE: 0.82 mg/dL (ref 0.57–1.00)
Calcium: 9.5 mg/dL (ref 8.7–10.2)
Chloride: 103 mmol/L (ref 96–106)
GFR calc Af Amer: 96 mL/min/{1.73_m2} (ref >59)
GFR calc non Af Amer: 84 mL/min/{1.73_m2} (ref >59)
GLUCOSE: 104 mg/dL — AB (ref 65–99)
Globulin, Total: 2.6 g/dL (ref 1.5–4.5)
Potassium: 5.4 mmol/L — ABNORMAL HIGH (ref 3.5–5.2)
Sodium: 143 mmol/L (ref 134–144)
Total Protein: 7.2 g/dL (ref 6.0–8.5)

## 2016-03-23 LAB — NMR, LIPOPROFILE
Cholesterol: 252 mg/dL — ABNORMAL HIGH (ref 100–199)
HDL Cholesterol by NMR: 57 mg/dL (ref >39)
HDL Particle Number: 35.9 umol/L (ref >=30.5)
LDL PARTICLE NUMBER: 2160 nmol/L — AB (ref <1000)
LDL SIZE: 20.8 nm (ref >20.5)
LDL-C: 171 mg/dL — ABNORMAL HIGH (ref 0–99)
LP-IR Score: 63 — ABNORMAL HIGH (ref <=45)
Small LDL Particle Number: 908 nmol/L — ABNORMAL HIGH (ref <=527)
Triglycerides by NMR: 120 mg/dL (ref 0–149)

## 2016-03-23 LAB — HEMOGLOBIN A1C
ESTIMATED AVERAGE GLUCOSE: 131 mg/dL
Hgb A1c MFr Bld: 6.2 % — ABNORMAL HIGH (ref 4.8–5.6)

## 2016-03-23 LAB — CBC
Hematocrit: 42.7 % (ref 34.0–46.6)
Hemoglobin: 14.4 g/dL (ref 11.1–15.9)
MCH: 28 pg (ref 26.6–33.0)
MCHC: 33.7 g/dL (ref 31.5–35.7)
MCV: 83 fL (ref 79–97)
PLATELETS: 370 10*3/uL (ref 150–379)
RBC: 5.14 x10E6/uL (ref 3.77–5.28)
RDW: 14.3 % (ref 12.3–15.4)
WBC: 6.5 10*3/uL (ref 3.4–10.8)

## 2016-03-23 LAB — VITAMIN D 25 HYDROXY (VIT D DEFICIENCY, FRACTURES): Vit D, 25-Hydroxy: 29.1 ng/mL — ABNORMAL LOW (ref 30.0–100.0)

## 2016-03-23 LAB — THYROID PANEL WITH TSH
Free Thyroxine Index: 1.5 (ref 1.2–4.9)
T3 UPTAKE RATIO: 23 % — AB (ref 24–39)
T4 TOTAL: 6.7 ug/dL (ref 4.5–12.0)
TSH: 1.15 u[IU]/mL (ref 0.450–4.500)

## 2016-03-23 LAB — CYTOLOGY - PAP

## 2016-03-27 ENCOUNTER — Other Ambulatory Visit: Payer: Self-pay | Admitting: Obstetrics and Gynecology

## 2016-03-27 DIAGNOSIS — E559 Vitamin D deficiency, unspecified: Secondary | ICD-10-CM | POA: Insufficient documentation

## 2016-03-27 MED ORDER — ATORVASTATIN CALCIUM 20 MG PO TABS: 20.0000 mg | ORAL_TABLET | Freq: Every day | ORAL | 6 refills | Status: DC

## 2016-03-27 MED ORDER — VITAMIN D (ERGOCALCIFEROL) 1.25 MG (50000 UNIT) PO CAPS: 50000.0000 [IU] | ORAL_CAPSULE | ORAL | 1 refills | Status: DC

## 2016-07-17 ENCOUNTER — Other Ambulatory Visit: Payer: Self-pay

## 2016-07-17 DIAGNOSIS — Z299 Encounter for prophylactic measures, unspecified: Secondary | ICD-10-CM

## 2016-07-17 NOTE — Progress Notes (Signed)
Patient in office today for labwork only requested via Dr[facility] Patrecia PaceMorayati [facility]. Drawn from right arm

## 2016-07-18 LAB — CMP12+LP+TP+TSH+6AC+CBC/D/PLT
ALT: 25 IU/L (ref 0–32)
AST: 23 IU/L (ref 0–40)
Albumin/Globulin Ratio: 1.7 (ref 1.2–2.2)
Albumin: 4.4 g/dL (ref 3.5–5.5)
Alkaline Phosphatase: 67 IU/L (ref 39–117)
BASOS ABS: 0 10*3/uL (ref 0.0–0.2)
BILIRUBIN TOTAL: 0.7 mg/dL (ref 0.0–1.2)
BUN / CREAT RATIO: 15 (ref 9–23)
BUN: 12 mg/dL (ref 6–24)
Basos: 0 %
CHOL/HDL RATIO: 5.7 ratio — AB (ref 0.0–4.4)
CREATININE: 0.8 mg/dL (ref 0.57–1.00)
Calcium: 9.2 mg/dL (ref 8.7–10.2)
Chloride: 102 mmol/L (ref 96–106)
Cholesterol, Total: 241 mg/dL — ABNORMAL HIGH (ref 100–199)
EOS (ABSOLUTE): 0.1 10*3/uL (ref 0.0–0.4)
EOS: 2 %
Estimated CHD Risk: 1.6 times avg. — ABNORMAL HIGH (ref 0.0–1.0)
Free Thyroxine Index: 1.4 (ref 1.2–4.9)
GFR calc Af Amer: 99 mL/min/{1.73_m2} (ref >59)
GFR, EST NON AFRICAN AMERICAN: 86 mL/min/{1.73_m2} (ref >59)
GGT: 24 IU/L (ref 0–60)
GLUCOSE: 108 mg/dL — AB (ref 65–99)
Globulin, Total: 2.6 g/dL (ref 1.5–4.5)
HDL: 42 mg/dL (ref >39)
Hematocrit: 43.2 % (ref 34.0–46.6)
Hemoglobin: 14.6 g/dL (ref 11.1–15.9)
Immature Grans (Abs): 0 10*3/uL (ref 0.0–0.1)
Immature Granulocytes: 0 %
Iron: 124 ug/dL (ref 27–159)
LDH: 199 IU/L (ref 119–226)
LDL Calculated: 149 mg/dL — ABNORMAL HIGH (ref 0–99)
Lymphocytes Absolute: 2.8 10*3/uL (ref 0.7–3.1)
Lymphs: 41 %
MCH: 28.1 pg (ref 26.6–33.0)
MCHC: 33.8 g/dL (ref 31.5–35.7)
MCV: 83 fL (ref 79–97)
MONOCYTES: 6 %
Monocytes Absolute: 0.4 10*3/uL (ref 0.1–0.9)
Neutrophils Absolute: 3.6 10*3/uL (ref 1.4–7.0)
Neutrophils: 51 %
PLATELETS: 354 10*3/uL (ref 150–379)
Phosphorus: 3.9 mg/dL (ref 2.5–4.5)
Potassium: 4.6 mmol/L (ref 3.5–5.2)
RBC: 5.19 x10E6/uL (ref 3.77–5.28)
RDW: 13.7 % (ref 12.3–15.4)
Sodium: 140 mmol/L (ref 134–144)
T3 UPTAKE RATIO: 25 % (ref 24–39)
T4, Total: 5.6 ug/dL (ref 4.5–12.0)
TSH: 1.51 u[IU]/mL (ref 0.450–4.500)
Total Protein: 7 g/dL (ref 6.0–8.5)
Triglycerides: 251 mg/dL — ABNORMAL HIGH (ref 0–149)
URIC ACID: 4.8 mg/dL (ref 2.5–7.1)
VLDL CHOLESTEROL CAL: 50 mg/dL — AB (ref 5–40)
WBC: 7 10*3/uL (ref 3.4–10.8)

## 2016-07-18 LAB — HEMOGLOBIN A1C
Est. average glucose Bld gHb Est-mCnc: 131 mg/dL
HEMOGLOBIN A1C: 6.2 % — AB (ref 4.8–5.6)

## 2016-08-21 ENCOUNTER — Ambulatory Visit
Admission: RE | Admit: 2016-08-21 | Discharge: 2016-08-21 | Disposition: A | Discharge status: Home / Self Care | Payer: Managed Care, Other (non HMO) | Source: Ambulatory Visit | Attending: Obstetrics and Gynecology | Admitting: Obstetrics and Gynecology

## 2016-08-21 DIAGNOSIS — Z01419 Encounter for gynecological examination (general) (routine) without abnormal findings: Secondary | ICD-10-CM

## 2016-08-21 DIAGNOSIS — Z1231 Encounter for screening mammogram for malignant neoplasm of breast: Secondary | ICD-10-CM | POA: Diagnosis not present

## 2016-08-27 ENCOUNTER — Ambulatory Visit: Payer: Self-pay | Admitting: Physician Assistant

## 2016-08-27 VITALS — BP 120/80 | HR 103 | Temp 99.0°F

## 2016-08-27 DIAGNOSIS — J101 Influenza due to other identified influenza virus with other respiratory manifestations: Secondary | ICD-10-CM

## 2016-08-27 LAB — POCT INFLUENZA A/B
Influenza A, POC: NEGATIVE
Influenza B, POC: POSITIVE — AB

## 2016-08-27 NOTE — Progress Notes (Signed)
S: C/o runny nose and congestion with dry cough for 4 days, + fever, chills, denies cp/sob, v/d; mucus was green this am but clear throughout the day, cough is sporadic, boyfriend and his family has the flu  Using otc meds: robitussin  O: PE: vitals wnl, nad,  perrl eomi, normocephalic, tms dull, nasal mucosa red and swollen, throat injected, neck supple no lymph, lungs c t a, cv rrr, neuro intact, flu swab +B  A:  Acute influenza B   P: drink fluids, continue regular meds , use otc meds of choice, return if not improving in 5 days, return earlier if worsening

## 2017-03-25 ENCOUNTER — Encounter: Payer: Managed Care, Other (non HMO) | Admitting: Obstetrics and Gynecology

## 2017-06-28 ENCOUNTER — Ambulatory Visit: Payer: Self-pay | Admitting: Emergency Medicine

## 2017-06-28 VITALS — BP 130/90 | HR 139 | Temp 100.7°F

## 2017-06-28 DIAGNOSIS — R69 Illness, unspecified: Secondary | ICD-10-CM

## 2017-06-28 DIAGNOSIS — Z299 Encounter for prophylactic measures, unspecified: Secondary | ICD-10-CM

## 2017-06-28 DIAGNOSIS — J111 Influenza due to unidentified influenza virus with other respiratory manifestations: Secondary | ICD-10-CM

## 2017-06-28 LAB — POCT INFLUENZA A/B
Influenza A, POC: NEGATIVE
Influenza B, POC: NEGATIVE

## 2017-06-28 MED ORDER — OSELTAMIVIR PHOSPHATE 75 MG PO CAPS: ORAL_CAPSULE | ORAL | 0 refills | Status: DC

## 2017-06-28 NOTE — Patient Instructions (Addendum)
Based on your history and exam today, you likely have influenza, even though the quick flu test is negative (the quick flu tests are sometimes negative even if someone has influenza). So, we are starting prescription of Tamiflu.  See also influenza instructions below. Work note: Excuse from work 2/18-2/20/2019. May return to work on Thursday 07/01/2017 if you are feeling better and don't have a fever     Influenza, Adult Influenza, more commonly known as "the flu," is a viral infection that primarily affects the respiratory tract. The respiratory tract includes organs that help you breathe, such as the lungs, nose, and throat. The flu causes many common cold symptoms, as well as a high fever and body aches. The flu spreads easily from person to person (is contagious). Getting a flu shot (influenza vaccination) every year is the best way to prevent influenza. What are the causes? Influenza is caused by a virus. You can catch the virus by:  Breathing in droplets from an infected person's cough or sneeze.  Touching something that was recently contaminated with the virus and then touching your mouth, nose, or eyes.  What increases the risk? The following factors may make you more likely to get the flu:  Not cleaning your hands frequently with soap and water or alcohol-based hand sanitizer.  Having close contact with many people during cold and flu season.  Touching your mouth, eyes, or nose without washing or sanitizing your hands first.  Not drinking enough fluids or not eating a healthy diet.  Not getting enough sleep or exercise.  Being under a high amount of stress.  Not getting a yearly (annual) flu shot.  You may be at a higher risk of complications from the flu, such as a severe lung infection (pneumonia), if you:  Are over the age of 42.  Are pregnant.  Have a weakened disease-fighting system (immune system). You may have a weakened immune system if you: ? Have HIV or  AIDS. ? Are undergoing chemotherapy. ? Aretaking medicines that reduce the activity of (suppress) the immune system.  Have a long-term (chronic) illness, such as heart disease, kidney disease, diabetes, or lung disease.  Have a liver disorder.  Are obese.  Have anemia.  What are the signs or symptoms? Symptoms of this condition typically last 4-10 days and may include:  Fever.  Chills.  Headache, body aches, or muscle aches.  Sore throat.  Cough.  Runny or congested nose.  Chest discomfort and cough.  Poor appetite.  Weakness or tiredness (fatigue).  Dizziness.  Nausea or vomiting.  How is this diagnosed? This condition may be diagnosed based on your medical history and a physical exam. Your health care provider may do a nose or throat swab test to confirm the diagnosis. How is this treated? If influenza is detected early, you can be treated with antiviral medicine that can reduce the length of your illness and the severity of your symptoms. This medicine may be given by mouth (orally) or through an IV tube that is inserted in one of your veins. The goal of treatment is to relieve symptoms by taking care of yourself at home. This may include taking over-the-counter medicines, drinking plenty of fluids, and adding humidity to the air in your home. In some cases, influenza goes away on its own. Severe influenza or complications from influenza may be treated in a hospital. Follow these instructions at home:  Take over-the-counter and prescription medicines only as told by your health care provider.  Use a cool mist humidifier to add humidity to the air in your home. This can make breathing easier.  Rest as needed.  Drink enough fluid to keep your urine clear or pale yellow.  Cover your mouth and nose when you cough or sneeze.  Wash your hands with soap and water often, especially after you cough or sneeze. If soap and water are not available, use hand  sanitizer.  Stay home from work or school as told by your health care provider. Unless you are visiting your health care provider, try to avoid leaving home until your fever has been gone for 24 hours without the use of medicine.  Keep all follow-up visits as told by your health care provider. This is important. How is this prevented?  Getting an annual flu shot is the best way to avoid getting the flu. You may get the flu shot in late summer, fall, or winter. Ask your health care provider when you should get your flu shot.  Wash your hands often or use hand sanitizer often.  Avoid contact with people who are sick during cold and flu season.  Eat a healthy diet, drink plenty of fluids, get enough sleep, and exercise regularly. Contact a health care provider if:  You develop new symptoms.  You have: ? Chest pain. ? Diarrhea. ? A fever.  Your cough gets worse.  You produce more mucus.  You feel nauseous or you vomit. Get help right away if:  You develop shortness of breath or difficulty breathing.  Your skin or nails turn a bluish color.  You have severe pain or stiffness in your neck.  You develop a sudden headache or sudden pain in your face or ear.  You cannot stop vomiting. This information is not intended to replace advice given to you by your health care provider. Make sure you discuss any questions you have with your health care provider. Document Released: 04/24/2000 Document Revised: 10/03/2015 Document Reviewed: 02/19/2015 Elsevier Interactive Patient Education  2017 [employer]

## 2017-06-28 NOTE — Progress Notes (Signed)
FLU  HPI : Flu symptoms for about 1 day. Fever to 102 with chills, sweats, myalgias, fatigue, headache. Symptoms are progressively worsening, despite trying OTC fever reducing medicine and rest and fluids. Has decreased appetite, but tolerating some liquids by mouth. No history of recent tick bite. She was exposed recently to a family member who had influenza.  Review of Systems: Positive for fatigue, mild nasal congestion, mild sore throat, mild swollen anterior neck glands, mild cough. Negative for acute vision changes, stiff neck, focal weakness, syncope, seizures, respiratory distress, vomiting, diarrhea, GU symptoms, new rash.  Objective: Appears very ill, but not toxic. She is alert, cooperative, ambulatory. BP 130/90, pulse 139, repeated at 108, regular. Temp 100.7, O2 saturation 96% room air. HEENT: Negative. Neck, supple, minimally enlarged minimally tender anterior cervical nodes. Chest: Clear Heart: Tachycardia. Pulse 108 by me. Regular rhythm, no murmur Abdomen: Nondistended Skin: Very warm and dry. No rash  Assessment: Although rapid flu test negative, she likely has acute influenza   Treatment options discussed, as well as risks, benefits, alternatives. Patient voiced understanding and agreement with the following plans: Tamiflu prescribed An After Visit Summary was printed and given to the patient:  Based on your history and exam today, you likely have influenza, even though the quick flu test is negative (the quick flu tests are sometimes negative even if someone has influenza). So, we are starting prescription of Tamiflu.  See also influenza instruction sheets. Work note: Excuse from work 2/18-2/20/2019.  Follow-up with your primary care doctor in 5-7 days if not improving, or sooner if symptoms become worse. Precautions discussed. Red flags discussed. Questions invited and answered. Patient voiced understanding and agreement.

## 2018-11-04 ENCOUNTER — Other Ambulatory Visit: Payer: Self-pay | Admitting: Obstetrics and Gynecology

## 2018-11-04 DIAGNOSIS — Z1231 Encounter for screening mammogram for malignant neoplasm of breast: Secondary | ICD-10-CM

## 2018-12-22 ENCOUNTER — Other Ambulatory Visit: Payer: Self-pay

## 2018-12-22 ENCOUNTER — Ambulatory Visit
Admission: RE | Admit: 2018-12-22 | Discharge: 2018-12-22 | Disposition: A | Discharge status: Home / Self Care | Payer: Managed Care, Other (non HMO) | Source: Ambulatory Visit | Attending: Obstetrics and Gynecology | Admitting: Obstetrics and Gynecology

## 2018-12-22 DIAGNOSIS — Z1231 Encounter for screening mammogram for malignant neoplasm of breast: Secondary | ICD-10-CM | POA: Insufficient documentation

## 2019-08-16 ENCOUNTER — Other Ambulatory Visit: Payer: Self-pay

## 2019-08-16 ENCOUNTER — Ambulatory Visit: Payer: Managed Care, Other (non HMO) | Admitting: Emergency Medicine

## 2019-08-16 ENCOUNTER — Other Ambulatory Visit: Payer: Self-pay | Admitting: Emergency Medicine

## 2019-08-16 VITALS — BP 120/80 | HR 82 | Temp 98.0°F | Resp 16 | Ht <= 58 in | Wt 193.0 lb

## 2019-08-16 DIAGNOSIS — M5431 Sciatica, right side: Secondary | ICD-10-CM

## 2019-08-16 NOTE — Progress Notes (Signed)
S:  Reports back pain after flying and riding in a car for several hours.  Had some pain in right posterior hip area while flying but did stretches and improved.  Slept on a bad mattress while made her back begin hurting again.  Now continues to have pain with radiation down right thigh and stops above knee area.  Patient taking tylenol with limited relief until tylenol wears off.  Denies any urinary sx. Has had previous back problems that were resolved with back exercises.  No longer do the exercises.  No incontinence of bowel or bladder. Patient has continued to ambulate without assistance.    O:  No gross deformity noted.  Nontender to palpation thoracic or lumbar spine. Marked tenderness noted to palpation of Right SI joint and surrounding tissue greater than the left.  Good muscle strength bilaterally.  SLR negative. Normal gait noted.  A: Sciatica right sided  Plan:  Prednisone 10 mg 6 day taper.  Ice or heat to her back.  Pillow when in bed under knees or between knees depending on how she is sleeping.  Take tylenol with prednisone if additional pain medication is needed.  To ER immediately if any incontinence of bowel or bladder.

## 2019-10-27 ENCOUNTER — Other Ambulatory Visit: Payer: Self-pay

## 2019-10-27 ENCOUNTER — Ambulatory Visit: Payer: Managed Care, Other (non HMO) | Admitting: Physician Assistant

## 2019-10-27 VITALS — BP 124/72 | HR 87 | Resp 18 | Ht 59.0 in | Wt 193.0 lb

## 2019-10-27 DIAGNOSIS — Z008 Encounter for other general examination: Secondary | ICD-10-CM

## 2019-10-27 NOTE — Progress Notes (Signed)
54 yo F  ( birthday pending [birth year] ! ) Presents  Biometrics and screening exam Denies current concerns-  Had sciatica in early June after a prolonged road trip Has back exercises from previous episodes - but has stopped using them Realized the other day after longer car ride that area irritated again- Resolved again Hx of adiposity - no significant change- 4'11"   193 lb ( waist 42)  no active dietary management - no exercise program Plans car trip to the mountains this weekend and concerned about re-awakening the problem No numbness or tingling , no functional deficit , no perineal /saddle discomfort or numbness  Reports annual exam with PCP. Had pap with Gyn at [facility]. SBE Does DDS exam q 6 months Defers Covid vaccine- counseled  Over 50 and no colonoscopy- encouraged to schedule Family hx of elevated cholesterol she take Rx and doesn't particularly consider diet  Review of systems; Non-contributary except as noted  Exam : HEENT - grossly WNL, minimal cerumen right canal non-obscuring; EOMI  Dental good repair, Neck supple without glandular enlargment Lungs - CTA Heart RSR  No MGR Abd - obese, nontender, no mass, no tenderness elicited, nml BS Pelvic - deferred , denied concerns Back- no tenderness at either SI , comfortable ROM today Ext- no edema, no varicosities Neuro- CNS WNL, DTS 2+ equal  PLAN-  Discuss discussed healthy food choices and portion control. Daily 30 minute brisk walk Counseled that family hx of elevated cholesterol increases her need to manage dietary intake and exercise  Continue care with PCP_ consider dietary/nutirtional counseling  Discussed leaving car every 60 -90 minutes for brief walk while on road trip. Do back exercises daily..  Looking forward to [employer] [address] [employer] ( 13 miles) and hopes to do some walking-- Make it a daily commitment encouraged  Labs to be reported as available

## 2019-10-31 LAB — LIPID PANEL
Chol/HDL Ratio: 3.9 ratio (ref 0.0–4.4)
Cholesterol, Total: 153 mg/dL (ref 100–199)
HDL: 39 mg/dL — ABNORMAL LOW (ref >39)
LDL Chol Calc (NIH): 87 mg/dL (ref 0–99)
Triglycerides: 154 mg/dL — ABNORMAL HIGH (ref 0–149)
VLDL Cholesterol Cal: 27 mg/dL (ref 5–40)

## 2019-10-31 LAB — GLUCOSE, RANDOM: Glucose: 102 mg/dL — ABNORMAL HIGH (ref 65–99)

## 2020-03-19 ENCOUNTER — Other Ambulatory Visit (HOSPITAL_COMMUNITY): Payer: Self-pay | Admitting: Sports Medicine

## 2020-03-19 DIAGNOSIS — M25461 Effusion, right knee: Secondary | ICD-10-CM

## 2020-03-19 DIAGNOSIS — M25561 Pain in right knee: Secondary | ICD-10-CM

## 2020-03-19 DIAGNOSIS — S8991XA Unspecified injury of right lower leg, initial encounter: Secondary | ICD-10-CM

## 2020-03-28 ENCOUNTER — Other Ambulatory Visit: Payer: Self-pay

## 2020-03-28 ENCOUNTER — Ambulatory Visit
Admission: RE | Admit: 2020-03-28 | Discharge: 2020-03-28 | Disposition: A | Discharge status: Home / Self Care | Payer: Managed Care, Other (non HMO) | Source: Ambulatory Visit | Attending: Sports Medicine | Admitting: Sports Medicine

## 2020-03-28 DIAGNOSIS — M25461 Effusion, right knee: Secondary | ICD-10-CM | POA: Diagnosis present

## 2020-03-28 DIAGNOSIS — S8991XA Unspecified injury of right lower leg, initial encounter: Secondary | ICD-10-CM | POA: Diagnosis present

## 2020-03-28 DIAGNOSIS — M25561 Pain in right knee: Secondary | ICD-10-CM | POA: Diagnosis present

## 2020-04-18 ENCOUNTER — Other Ambulatory Visit: Payer: Self-pay | Admitting: Orthopedic Surgery

## 2020-04-26 ENCOUNTER — Other Ambulatory Visit: Payer: Self-pay

## 2020-04-26 ENCOUNTER — Other Ambulatory Visit
Admission: RE | Admit: 2020-04-26 | Discharge: 2020-04-26 | Disposition: A | Discharge status: Home / Self Care | Payer: PRIVATE HEALTH INSURANCE | Source: Ambulatory Visit | Attending: Orthopedic Surgery | Admitting: Orthopedic Surgery

## 2020-04-26 HISTORY — DX: Prediabetes: R73.03

## 2020-04-26 HISTORY — DX: Other complications of anesthesia, initial encounter: T88.59XA

## 2020-04-26 HISTORY — DX: Pneumonia, unspecified organism: J18.9

## 2020-04-26 NOTE — Patient Instructions (Addendum)
Your procedure is scheduled on: Report to the Registration Desk on the 1st floor of the [facility]. To find out your arrival time, please call [phone] between 1PM - 3PM on: 05/02/20- Thursday  REMEMBER: Instructions that are not followed completely may result in serious medical risk, up to and including death; or upon the discretion of your surgeon and anesthesiologist your surgery may need to be rescheduled.  Do not eat food after midnight the night before surgery.  No gum chewing, lozengers or hard candies.  You may however, drink CLEAR liquids up to 2 hours before you are scheduled to arrive for your surgery. Do not drink anything within 2 hours of your scheduled arrival time.  Clear liquids include: - water  - apple juice without pulp - gatorade (not RED, PURPLE, OR BLUE) - black coffee or tea (Do NOT add milk or creamers to the coffee or tea) Do NOT drink anything that is not on this list.  Type 1 and Type 2 diabetics should only drink water.  In addition, your doctor has ordered for you to drink the provided  Ensure Pre-Surgery Clear Carbohydrate Drink  Drinking this carbohydrate drink up to two hours before surgery helps to reduce insulin resistance and improve patient outcomes. Please complete drinking 2 hours prior to scheduled arrival time.  TAKE THESE MEDICATIONS THE MORNING OF SURGERY WITH A SIP OF WATER: - rosuvastatin (CRESTOR) 5 MG tablet  One week prior to surgery: Stop Anti-inflammatories (NSAIDS) such as Advil, Aleve, Ibuprofen, Motrin, Naproxen, Naprosyn and Aspirin based products such as Excedrin, Goodys Powder, BC Powder.  Stop ANY OVER THE COUNTER supplements until after surgery. Stop Vitamin C on 04/28/20. (However, you may continue taking Vitamin D, Vitamin B, and multivitamin up until the day before surgery.)  No Alcohol for 24 hours before or after surgery.  No Smoking including e-cigarettes for 24 hours prior to surgery.  No chewable tobacco  products for at least 6 hours prior to surgery.  No nicotine patches on the day of surgery.  Do not use any "recreational" drugs for at least a week prior to your surgery.  Please be advised that the combination of cocaine and anesthesia may have negative outcomes, up to and including death. If you test positive for cocaine, your surgery will be cancelled.  On the morning of surgery brush your teeth with toothpaste and water, you may rinse your mouth with mouthwash if you wish. Do not swallow any toothpaste or mouthwash.  Do not wear jewelry, make-up, hairpins, clips or nail polish.  Do not wear lotions, powders, or perfumes.   Do not shave body from the neck down 48 hours prior to surgery just in case you cut yourself which could leave a site for infection.  Also, freshly shaved skin may become irritated if using the CHG soap.  Contact lenses, hearing aids and dentures may not be worn into surgery.  Do not bring valuables to the hospital. [facility] is not responsible for any missing/lost belongings or valuables.   Use CHG Soap or wipes as directed on instruction sheet.  Notify your doctor if there is any change in your medical condition (cold, fever, infection).  Wear comfortable clothing (specific to your surgery type) to the hospital.  Plan for stool softeners for home use; pain medications have a tendency to cause constipation. You can also help prevent constipation by eating foods high in fiber such as fruits and vegetables and drinking plenty of fluids as your diet allows.  After surgery, you can help prevent lung complications by doing breathing exercises.  Take deep breaths and cough every 1-2 hours. Your doctor may order a device called an Incentive Spirometer to help you take deep breaths. When coughing or sneezing, hold a pillow firmly against your incision with both hands. This is called "splinting." Doing this helps protect your incision. It also decreases belly  discomfort.  If you are being admitted to the hospital overnight, leave your suitcase in the car. After surgery it may be brought to your room.  If you are being discharged the day of surgery, you will not be allowed to drive home. You will need a responsible adult (18 years or older) to drive you home and stay with you that night.   If you are taking public transportation, you will need to have a responsible adult (18 years or older) with you. Please confirm with your physician that it is acceptable to use public transportation.   Please call the Pre-admissions Testing Dept. at [phone] if you have any questions about these instructions.  Visitation Policy:  Patients undergoing a surgery or procedure may have one family member or support person with them as long as that person is not COVID-19 positive or experiencing its symptoms.  That person may remain in the waiting area during the procedure.  Inpatient Visitation Update:   In an effort to ensure the safety of our team members and our patients, we are implementing a change to our visitation policy:  Effective Monday, Aug. 9, at 7 a.m., inpatients will be allowed one support person.  o The support person may change daily.  o The support person must pass our screening, gel in and out, and wear a mask at all times, including in the patient's room.  o Patients must also wear a mask when staff or their support person are in the room.  o Masking is required regardless of vaccination status.  Systemwide, no visitors 17 or younger.

## 2020-05-02 ENCOUNTER — Other Ambulatory Visit: Payer: Self-pay

## 2020-05-02 ENCOUNTER — Other Ambulatory Visit
Admission: RE | Admit: 2020-05-02 | Discharge: 2020-05-02 | Disposition: A | Discharge status: Home / Self Care | Payer: Managed Care, Other (non HMO) | Source: Ambulatory Visit | Attending: Orthopedic Surgery | Admitting: Orthopedic Surgery

## 2020-05-02 DIAGNOSIS — Z01812 Encounter for preprocedural laboratory examination: Secondary | ICD-10-CM | POA: Insufficient documentation

## 2020-05-02 DIAGNOSIS — Z20822 Contact with and (suspected) exposure to covid-19: Secondary | ICD-10-CM | POA: Diagnosis not present

## 2020-05-02 LAB — SARS CORONAVIRUS 2 (TAT 6-24 HRS): SARS Coronavirus 2: NEGATIVE

## 2020-05-06 ENCOUNTER — Ambulatory Visit
Admission: RE | Admit: 2020-05-06 | Discharge: 2020-05-06 | Disposition: A | Discharge status: Home / Self Care | Payer: Managed Care, Other (non HMO) | Attending: Orthopedic Surgery | Admitting: Orthopedic Surgery

## 2020-05-06 ENCOUNTER — Ambulatory Visit: Payer: Managed Care, Other (non HMO)

## 2020-05-06 ENCOUNTER — Encounter
Admission: RE | Disposition: A | Discharge status: Home / Self Care | Payer: Self-pay | Source: Home / Self Care | Attending: Orthopedic Surgery

## 2020-05-06 ENCOUNTER — Encounter: Payer: Self-pay | Admitting: Orthopedic Surgery

## 2020-05-06 ENCOUNTER — Ambulatory Visit: Payer: Managed Care, Other (non HMO) | Admitting: Anesthesiology

## 2020-05-06 ENCOUNTER — Other Ambulatory Visit: Payer: Self-pay

## 2020-05-06 DIAGNOSIS — S83511A Sprain of anterior cruciate ligament of right knee, initial encounter: Secondary | ICD-10-CM | POA: Diagnosis not present

## 2020-05-06 DIAGNOSIS — X501XXA Overexertion from prolonged static or awkward postures, initial encounter: Secondary | ICD-10-CM | POA: Insufficient documentation

## 2020-05-06 DIAGNOSIS — M25561 Pain in right knee: Secondary | ICD-10-CM

## 2020-05-06 DIAGNOSIS — M25461 Effusion, right knee: Secondary | ICD-10-CM | POA: Insufficient documentation

## 2020-05-06 DIAGNOSIS — G8929 Other chronic pain: Secondary | ICD-10-CM

## 2020-05-06 HISTORY — PX: KNEE ARTHROSCOPY WITH ANTERIOR CRUCIATE LIGAMENT (ACL) REPAIR WITH HAMSTRING GRAFT: SHX5645

## 2020-05-06 SURGERY — KNEE ARTHROSCOPY WITH ANTERIOR CRUCIATE LIGAMENT (ACL) REPAIR WITH HAMSTRING GRAFT
Anesthesia: Regional | Laterality: Right

## 2020-05-06 MED ORDER — ROCURONIUM BROMIDE 100 MG/10ML IV SOLN
INTRAVENOUS | Status: DC | PRN
  Administered 2020-05-06: 50 mg via INTRAVENOUS

## 2020-05-06 MED ORDER — LACTATED RINGERS IV SOLN
INTRAVENOUS | Status: DC | PRN
  Administered 2020-05-06: 13:00:00 4 mL

## 2020-05-06 MED ORDER — EPINEPHRINE PF 1 MG/ML IJ SOLN
INTRAMUSCULAR | Status: AC
  Filled 2020-05-06: qty 4

## 2020-05-06 MED ORDER — LIDOCAINE HCL (PF) 2 % IJ SOLN
INTRAMUSCULAR | Status: AC
  Filled 2020-05-06: qty 5

## 2020-05-06 MED ORDER — ACETAMINOPHEN 500 MG PO TABS
ORAL_TABLET | ORAL | Status: AC
  Filled 2020-05-06: qty 2

## 2020-05-06 MED ORDER — FENTANYL CITRATE (PF) 100 MCG/2ML IJ SOLN
INTRAMUSCULAR | Status: AC
  Administered 2020-05-06: 12:00:00 50 ug via INTRAVENOUS
  Filled 2020-05-06: qty 2

## 2020-05-06 MED ORDER — ROPIVACAINE HCL 5 MG/ML IJ SOLN
INTRAMUSCULAR | Status: DC | PRN
  Administered 2020-05-06: 30 mL via PERINEURAL

## 2020-05-06 MED ORDER — FENTANYL CITRATE (PF) 100 MCG/2ML IJ SOLN
25.0000 ug | INTRAMUSCULAR | Status: DC | PRN
  Administered 2020-05-06 (×4): 25 ug via INTRAVENOUS

## 2020-05-06 MED ORDER — FENTANYL CITRATE (PF) 100 MCG/2ML IJ SOLN
INTRAMUSCULAR | Status: AC
  Filled 2020-05-06: qty 2

## 2020-05-06 MED ORDER — CEFAZOLIN SODIUM-DEXTROSE 2-4 GM/100ML-% IV SOLN
2.0000 g | INTRAVENOUS | Status: AC
  Administered 2020-05-06: 12:00:00 2 g via INTRAVENOUS

## 2020-05-06 MED ORDER — IBUPROFEN 800 MG PO TABS: 800.0000 mg | ORAL_TABLET | Freq: Three times a day (TID) | ORAL | 1 refills | Status: AC

## 2020-05-06 MED ORDER — PHENYLEPHRINE HCL (PRESSORS) 10 MG/ML IV SOLN
INTRAVENOUS | Status: AC
  Filled 2020-05-06: qty 1

## 2020-05-06 MED ORDER — ROCURONIUM BROMIDE 10 MG/ML (PF) SYRINGE
PREFILLED_SYRINGE | INTRAVENOUS | Status: AC
  Filled 2020-05-06: qty 10

## 2020-05-06 MED ORDER — ASPIRIN EC 325 MG PO TBEC: 325.0000 mg | DELAYED_RELEASE_TABLET | Freq: Every day | ORAL | 0 refills | Status: AC

## 2020-05-06 MED ORDER — DEXAMETHASONE SODIUM PHOSPHATE 10 MG/ML IJ SOLN
INTRAMUSCULAR | Status: DC | PRN
  Administered 2020-05-06: 8 mg via INTRAVENOUS

## 2020-05-06 MED ORDER — FENTANYL CITRATE (PF) 100 MCG/2ML IJ SOLN: 50.0000 ug | Freq: Once | INTRAMUSCULAR | Status: AC

## 2020-05-06 MED ORDER — ROPIVACAINE HCL 5 MG/ML IJ SOLN
INTRAMUSCULAR | Status: AC
  Filled 2020-05-06: qty 30

## 2020-05-06 MED ORDER — OXYCODONE HCL 5 MG PO TABS
5.0000 mg | ORAL_TABLET | ORAL | Status: DC | PRN
  Administered 2020-05-06 (×2): 5 mg via ORAL

## 2020-05-06 MED ORDER — CEFAZOLIN SODIUM-DEXTROSE 2-4 GM/100ML-% IV SOLN
INTRAVENOUS | Status: AC
  Filled 2020-05-06: qty 100

## 2020-05-06 MED ORDER — FENTANYL CITRATE (PF) 100 MCG/2ML IJ SOLN
INTRAMUSCULAR | Status: DC | PRN
  Administered 2020-05-06: 100 ug via INTRAVENOUS

## 2020-05-06 MED ORDER — LIDOCAINE-EPINEPHRINE 1 %-1:100000 IJ SOLN
INTRAMUSCULAR | Status: AC
  Filled 2020-05-06: qty 1

## 2020-05-06 MED ORDER — ONDANSETRON HCL 4 MG/2ML IJ SOLN
INTRAMUSCULAR | Status: AC
  Filled 2020-05-06: qty 2

## 2020-05-06 MED ORDER — EPINEPHRINE PF 1 MG/ML IJ SOLN
INTRAMUSCULAR | Status: AC
  Filled 2020-05-06: qty 1

## 2020-05-06 MED ORDER — LIDOCAINE HCL (CARDIAC) PF 100 MG/5ML IV SOSY
PREFILLED_SYRINGE | INTRAVENOUS | Status: DC | PRN
  Administered 2020-05-06: 100 mg via INTRAVENOUS

## 2020-05-06 MED ORDER — KETOROLAC TROMETHAMINE 30 MG/ML IJ SOLN
15.0000 mg | Freq: Once | INTRAMUSCULAR | Status: AC
  Administered 2020-05-06: 17:00:00 15 mg via INTRAVENOUS

## 2020-05-06 MED ORDER — MIDAZOLAM HCL 2 MG/2ML IJ SOLN
INTRAMUSCULAR | Status: DC | PRN
  Administered 2020-05-06: 2 mg via INTRAVENOUS

## 2020-05-06 MED ORDER — KETOROLAC TROMETHAMINE 30 MG/ML IJ SOLN
INTRAMUSCULAR | Status: AC
  Filled 2020-05-06: qty 1

## 2020-05-06 MED ORDER — FAMOTIDINE 20 MG PO TABS
ORAL_TABLET | ORAL | Status: AC
  Administered 2020-05-06: 11:00:00 20 mg via ORAL
  Filled 2020-05-06: qty 1

## 2020-05-06 MED ORDER — ORAL CARE MOUTH RINSE: 15.0000 mL | Freq: Once | OROMUCOSAL | Status: AC

## 2020-05-06 MED ORDER — BUPIVACAINE HCL (PF) 0.5 % IJ SOLN
INTRAMUSCULAR | Status: AC
  Filled 2020-05-06: qty 30

## 2020-05-06 MED ORDER — CHLORHEXIDINE GLUCONATE 0.12 % MT SOLN: 15.0000 mL | Freq: Once | OROMUCOSAL | Status: AC

## 2020-05-06 MED ORDER — ONDANSETRON 4 MG PO TBDP: 4.0000 mg | ORAL_TABLET | Freq: Three times a day (TID) | ORAL | 0 refills | Status: DC | PRN

## 2020-05-06 MED ORDER — DEXAMETHASONE SODIUM PHOSPHATE 10 MG/ML IJ SOLN
INTRAMUSCULAR | Status: AC
  Filled 2020-05-06: qty 1

## 2020-05-06 MED ORDER — CHLORHEXIDINE GLUCONATE 0.12 % MT SOLN
OROMUCOSAL | Status: AC
  Administered 2020-05-06: 11:00:00 15 mL via OROMUCOSAL
  Filled 2020-05-06: qty 15

## 2020-05-06 MED ORDER — MIDAZOLAM HCL 2 MG/2ML IJ SOLN
INTRAMUSCULAR | Status: AC
  Filled 2020-05-06: qty 2

## 2020-05-06 MED ORDER — ONDANSETRON HCL 4 MG/2ML IJ SOLN: 4.0000 mg | Freq: Once | INTRAMUSCULAR | Status: DC | PRN

## 2020-05-06 MED ORDER — LIDOCAINE HCL (PF) 1 % IJ SOLN
INTRAMUSCULAR | Status: DC | PRN
Start: 2020-05-06 — End: 2020-05-06
  Administered 2020-05-06: .8 mL

## 2020-05-06 MED ORDER — ACETAMINOPHEN 500 MG PO TABS
1000.0000 mg | ORAL_TABLET | Freq: Once | ORAL | Status: AC
  Administered 2020-05-06: 17:00:00 1000 mg via ORAL

## 2020-05-06 MED ORDER — OXYCODONE HCL 5 MG PO TABS: 5.0000 mg | ORAL_TABLET | ORAL | 0 refills | Status: DC | PRN

## 2020-05-06 MED ORDER — PROPOFOL 10 MG/ML IV BOLUS
INTRAVENOUS | Status: DC | PRN
  Administered 2020-05-06: 140 mg via INTRAVENOUS

## 2020-05-06 MED ORDER — FAMOTIDINE 20 MG PO TABS: 20.0000 mg | ORAL_TABLET | Freq: Once | ORAL | Status: AC

## 2020-05-06 MED ORDER — LIDOCAINE HCL (PF) 1 % IJ SOLN
INTRAMUSCULAR | Status: AC
  Filled 2020-05-06: qty 5

## 2020-05-06 MED ORDER — MIDAZOLAM HCL 2 MG/2ML IJ SOLN
1.0000 mg | Freq: Once | INTRAMUSCULAR | Status: AC
  Administered 2020-05-06: 12:00:00 1 mg via INTRAVENOUS

## 2020-05-06 MED ORDER — OXYCODONE HCL 5 MG PO TABS
ORAL_TABLET | ORAL | Status: AC
  Filled 2020-05-06: qty 1

## 2020-05-06 MED ORDER — PROPOFOL 10 MG/ML IV BOLUS
INTRAVENOUS | Status: AC
  Filled 2020-05-06: qty 20

## 2020-05-06 MED ORDER — LACTATED RINGERS IV SOLN: INTRAVENOUS | Status: DC

## 2020-05-06 MED ORDER — ONDANSETRON HCL 4 MG/2ML IJ SOLN
INTRAMUSCULAR | Status: DC | PRN
  Administered 2020-05-06: 4 mg via INTRAVENOUS

## 2020-05-06 MED ORDER — ACETAMINOPHEN 500 MG PO TABS: 1000.0000 mg | ORAL_TABLET | Freq: Three times a day (TID) | ORAL | 2 refills | Status: DC

## 2020-05-06 SURGICAL SUPPLY — 86 items
"PENCIL ELECTRO HAND CTR " (MISCELLANEOUS) ×1 IMPLANT
ADAPTER IRRIG TUBE 2 SPIKE SOL (ADAPTER) ×6 IMPLANT
ADPR TBG 2 SPK PMP STRL ASCP (ADAPTER) ×2
ANCHOR TIGHTROPE RT 4POINT ACL (Anchor) ×2 IMPLANT
APL PRP STRL LF DISP 70% ISPRP (MISCELLANEOUS) ×1
BASIN GRAD PLASTIC 32OZ STRL (MISCELLANEOUS) ×3 IMPLANT
BLADE SURG 15 STRL LF DISP TIS (BLADE) ×2 IMPLANT
BLADE SURG 15 STRL SS (BLADE) ×6
BLADE SURG SZ10 CARB STEEL (BLADE) ×3 IMPLANT
BLADE SURG SZ11 CARB STEEL (BLADE) ×3 IMPLANT
BNDG COHESIVE 4X5 TAN STRL (GAUZE/BANDAGES/DRESSINGS) ×3 IMPLANT
BNDG COHESIVE 6X5 TAN STRL LF (GAUZE/BANDAGES/DRESSINGS) ×3 IMPLANT
BNDG ESMARK 6X12 TAN STRL LF (GAUZE/BANDAGES/DRESSINGS) ×3 IMPLANT
BRUSH SCRUB EZ  4% CHG (MISCELLANEOUS) ×2
BRUSH SCRUB EZ 4% CHG (MISCELLANEOUS) ×1 IMPLANT
BUR BR 5.5 12 FLUTE (BURR) IMPLANT
BUR RADIUS 4.0X18.5 (BURR) ×3 IMPLANT
CHLORAPREP W/TINT 26 (MISCELLANEOUS) ×3 IMPLANT
CLEANER CAUTERY TIP 5X5 PAD (MISCELLANEOUS) ×1 IMPLANT
CLOSURE WOUND 1/2 X4 (GAUZE/BANDAGES/DRESSINGS) ×1
COOLER POLAR GLACIER W/PUMP (MISCELLANEOUS) ×3 IMPLANT
COVER BACK TABLE REUSABLE LG (DRAPES) ×3 IMPLANT
COVER WAND RF STERILE (DRAPES) ×3 IMPLANT
CUFF TOURN SGL QUICK 24 (TOURNIQUET CUFF)
CUFF TOURN SGL QUICK 30 (TOURNIQUET CUFF) ×3
CUFF TRNQT CYL 24X4X16.5-23 (TOURNIQUET CUFF) IMPLANT
CUFF TRNQT CYL 30X4X21-28X (TOURNIQUET CUFF) IMPLANT
DRAPE 3/4 80X56 (DRAPES) ×6 IMPLANT
DRAPE ARTHRO LIMB 89X125 STRL (DRAPES) ×3 IMPLANT
DRAPE FLUOR MINI C-ARM 54X84 (DRAPES) ×3 IMPLANT
DRAPE INCISE IOBAN 66X45 STRL (DRAPES) ×2 IMPLANT
DRAPE POUCH INSTRU U-SHP 10X18 (DRAPES) ×3 IMPLANT
DRAPE SPLIT 6X30 W/TAPE (DRAPES) ×3 IMPLANT
DRILL FLIPCUTTER III 6-12 (ORTHOPEDIC DISPOSABLE SUPPLIES) IMPLANT
ELECT REM PT RETURN 9FT ADLT (ELECTROSURGICAL) ×3
ELECTRODE REM PT RTRN 9FT ADLT (ELECTROSURGICAL) ×1 IMPLANT
FLIPCUTTER III 6-12 AR-1204FF (ORTHOPEDIC DISPOSABLE SUPPLIES) ×3
GAUZE SPONGE 4X4 12PLY STRL (GAUZE/BANDAGES/DRESSINGS) ×3 IMPLANT
GAUZE XEROFORM 1X8 LF (GAUZE/BANDAGES/DRESSINGS) ×3 IMPLANT
GLOVE BIOGEL PI IND STRL 8 (GLOVE) ×1 IMPLANT
GLOVE BIOGEL PI INDICATOR 8 (GLOVE) ×2
GLOVE SURG SYN 8.0 (GLOVE) ×3 IMPLANT
GLOVE SURG SYN 8.0 PF PI (GLOVE) ×1 IMPLANT
GOWN STRL REUS W/ TWL LRG LVL3 (GOWN DISPOSABLE) ×1 IMPLANT
GOWN STRL REUS W/ TWL XL LVL3 (GOWN DISPOSABLE) ×1 IMPLANT
GOWN STRL REUS W/TWL LRG LVL3 (GOWN DISPOSABLE) ×3
GOWN STRL REUS W/TWL XL LVL3 (GOWN DISPOSABLE) ×3
GRADUATE 1200CC STRL 31836 (MISCELLANEOUS) ×3 IMPLANT
GRAFT TISS ANT TIB TNDN (Tissue) IMPLANT
GUIDEWIRE 1.2MMX18 (WIRE) ×3 IMPLANT
HANDLE YANKAUER SUCT BULB TIP (MISCELLANEOUS) ×3 IMPLANT
IV LACTATED RINGER IRRG 3000ML (IV SOLUTION) ×27
IV LR IRRIG 3000ML ARTHROMATIC (IV SOLUTION) ×6 IMPLANT
KIT TRANSTIBIAL (DISPOSABLE) ×2 IMPLANT
KIT TURNOVER KIT A (KITS) ×3 IMPLANT
MANIFOLD NEPTUNE II (INSTRUMENTS) ×6 IMPLANT
MAT ABSORB  FLUID 56X50 GRAY (MISCELLANEOUS) ×4
MAT ABSORB FLUID 56X50 GRAY (MISCELLANEOUS) ×2 IMPLANT
NEEDLE HYPO 22GX1.5 SAFETY (NEEDLE) ×3 IMPLANT
PACK ARTHROSCOPY KNEE (MISCELLANEOUS) ×3 IMPLANT
PAD ABD DERMACEA PRESS 5X9 (GAUZE/BANDAGES/DRESSINGS) ×6 IMPLANT
PAD CLEANER CAUTERY TIP 5X5 (MISCELLANEOUS) ×2
PAD WRAPON POLAR KNEE (MISCELLANEOUS) ×1 IMPLANT
PENCIL ELECTRO HAND CTR (MISCELLANEOUS) ×3 IMPLANT
REAMER LO PROFILE (MISCELLANEOUS) ×2 IMPLANT
SCREW INTERFERENCE FT BC 9X20 (Screw) ×2 IMPLANT
SET TUBE SUCT SHAVER OUTFL 24K (TUBING) ×3 IMPLANT
SET TUBE TIP INTRA-ARTICULAR (MISCELLANEOUS) ×3 IMPLANT
SPONGE LAP 18X18 RF (DISPOSABLE) ×6 IMPLANT
STRIP CLOSURE SKIN 1/2X4 (GAUZE/BANDAGES/DRESSINGS) ×2 IMPLANT
SUCTION FRAZIER HANDLE 10FR (MISCELLANEOUS)
SUCTION TUBE FRAZIER 10FR DISP (MISCELLANEOUS) IMPLANT
SUT ETHILON 3-0 FS-10 30 BLK (SUTURE) ×3
SUT FIBERWIRE #2 38 T-5 BLUE (SUTURE) ×6
SUT MNCRL AB 4-0 PS2 18 (SUTURE) ×3 IMPLANT
SUT VIC AB 0 CT1 36 (SUTURE) ×3 IMPLANT
SUT VIC AB 2-0 CT1 27 (SUTURE) ×3
SUT VIC AB 2-0 CT1 TAPERPNT 27 (SUTURE) ×1 IMPLANT
SUTURE EHLN 3-0 FS-10 30 BLK (SUTURE) ×1 IMPLANT
SUTURE FIBERWR #2 38 T-5 BLUE (SUTURE) ×2 IMPLANT
SYR BULB IRRIG 60ML STRL (SYRINGE) ×3 IMPLANT
TENDON ANTERIOR TIBIALIS (Tissue) ×3 IMPLANT
TRAY FOLEY SLVR 16FR LF STAT (SET/KITS/TRAYS/PACK) ×3 IMPLANT
TUBING ARTHRO INFLOW-ONLY STRL (TUBING) ×3 IMPLANT
WAND WEREWOLF FLOW 90D (MISCELLANEOUS) ×2 IMPLANT
WRAPON POLAR PAD KNEE (MISCELLANEOUS) ×3

## 2020-05-06 NOTE — Transfer of Care (Signed)
Immediate Anesthesia Transfer of Care Note  Patient: Mariah Mckay  Procedure(s) Performed: Right knee arthroscopy, ACL reconstruction using tibialis anterior tendon allograft, and chondroplasty - Horris Latino to Assist (Right )  Patient Location: PACU  Anesthesia Type:General and Regional  Level of Consciousness: sedated  Airway & Oxygen Therapy: Patient Spontanous Breathing and Patient connected to face mask oxygen  Post-op Assessment: Report given to RN and Post -op Vital signs reviewed and stable  Post vital signs: Reviewed and stable  Last Vitals:  Vitals Value Taken Time  BP 136/83 05/06/20 1420  Temp    Pulse 82 05/06/20 1423  Resp 15 05/06/20 1423  SpO2 100 % 05/06/20 1423  Vitals shown include unvalidated device data.  Last Pain:  Vitals:   05/06/20 1144  TempSrc:   PainSc: 0-No pain         Complications: No complications documented.

## 2020-05-06 NOTE — H&P (Signed)
Paper H&P to be scanned into permanent record. H&P reviewed. No significant changes noted.  

## 2020-05-06 NOTE — Discharge Instructions (Signed)
Arthroscopic ACL Surgery  Post-Op Instructions  1. Bracing or crutches: Crutches will be provided at the time of discharge from the surgery center.   2. Ice: You may be provided with a device [facility]) that allows you to ice the affected area effectively. Otherwise you can ice manually.   3. Driving:  Driving: Off all narcotic pain meds when operating vehicle   1 week for automatic cars, left leg surgery  2-4 weeks for standard/manual cars or right leg surgery  4. Activity: Ankle pumps several times an hour while awake to prevent blood clots. Weight bearing: weight bearing as tolerated with brace locked in extension for 1 week. Brace can be unlocked when appropriate quadriceps strength is achieved with physical therapy. Use crutches if there is pain and limping. Bending and straightening the knee is unlimited. Elevate knee above heart level as much as possible for one week. Avoid standing more than 5 minutes (consecutively) for the first week. No exercise involving the knee until cleared by the surgeon or physical therapist. Ideally, you should avoid long distance travel for 4 weeks.  5. Medications:  - You have been provided a prescription for narcotic pain medicine. After surgery, take 1-2 narcotic tablets every 4 hours if needed for severe pain.  - A prescription for anti-nausea medication will be provided in case the narcotic medicine causes nausea - take 1 tablet every 6 hours only if nauseated.  - Take ibuprofen 600 mg every 6 hours with food to reduce post-operative knee swelling. DO NOT STOP IBUPROFEN POST-OP UNTIL INSTRUCTED TO DO SO at first post-op office visit (10-14 days after surgery).  - Take enteric coated aspirin 325 mg once daily for 2 weeks to prevent blood clots.  -Take tylenol 500 mg every 6 hours for pain.  May stop tylenol 3 days after surgery if you are having minimal pain.  If you are taking prescription medication for anxiety, depression, insomnia, muscle  spasm, chronic pain, or for attention deficit disorder you are advised that you are at a higher risk of adverse effects with use of narcotics post-op, including narcotic addiction/dependence, depressed breathing, death. If you use non-prescribed substances: alcohol, marijuana, cocaine, heroin, methamphetamines, etc., you are at a higher risk of adverse effects with use of narcotics post-op, including narcotic addiction/dependence, depressed breathing, death. You are advised that taking > 50 morphine milligram equivalents (MME) of narcotic pain medication per day results in twice the risk of overdose or death. For your prescription provided: oxycodone 5 mg - taking more than 6 tablets per day. Be advised that we will prescribe narcotics short-term, for acute post-operative pain only - 1 week for minor operations such as knee arthroscopy for meniscus tear resection, and 3 weeks for major operations such as knee repair/reconstruction surgeries.   6. Bandages: The physical therapist should change the bandages at the first post-op appointment. If needed, the dressing supplies have been provided to you.  7. Physical Therapy: 2 times per week for the first 4 weeks, then 1-2 times per week from weeks 4-8 post-op. Therapy typically starts on post operative Day 3 or 4. You have been provided an order for physical therapy. The therapist will provide home exercises.  8. Work/School: May return when able to tolerate standing for greater than 2 hours and off of narcotic pain medicaitons  9. Post-Op Appointments: Your first post-op appointment will be with Dr. Allena Katz in approximately 2 weeks time.   If you find that they have not been scheduled please call  the Orthopaedic Appointment front desk at [phone].  AMBULATORY SURGERY  DISCHARGE INSTRUCTIONS   1) The drugs that you were given will stay in your system until tomorrow so for the next 24 hours you should not:  A) Drive an automobile B) Make any  legal decisions C) Drink any alcoholic beverage   2) You may resume regular meals tomorrow.  Today it is better to start with liquids and gradually work up to solid foods.  You may eat anything you prefer, but it is better to start with liquids, then soup and crackers, and gradually work up to solid foods.   3) Please notify your doctor immediately if you have any unusual bleeding, trouble breathing, redness and pain at the surgery site, drainage, fever, or pain not relieved by medication.    4) Additional Instructions:    Please contact your physician with any problems or Same Day Surgery at [phone], Monday through Friday 6 am to 4 pm, or [facility] number at [phone].

## 2020-05-06 NOTE — Anesthesia Procedure Notes (Signed)
Procedure Name: Intubation Date/Time: 05/06/2020 12:17 PM Performed by: Rona Ravens, CRNA Pre-anesthesia Checklist: Patient identified, Emergency Drugs available, Suction available, Patient being monitored and Timeout performed Patient Re-evaluated:Patient Re-evaluated prior to induction Oxygen Delivery Method: Circle system utilized Preoxygenation: Pre-oxygenation with 100% oxygen Induction Type: IV induction Ventilation: Mask ventilation without difficulty Laryngoscope Size: Mac and 3 Grade View: Grade II Tube type: Oral Tube size: 7.0 mm Number of attempts: 1 Airway Equipment and Method: Stylet Placement Confirmation: ETT inserted through vocal cords under direct vision,  positive ETCO2 and breath sounds checked- equal and bilateral Secured at: 22 cm Tube secured with: Tape Dental Injury: Teeth and Oropharynx as per pre-operative assessment

## 2020-05-06 NOTE — Anesthesia Postprocedure Evaluation (Signed)
Anesthesia Post Note  Patient: Mariah Mckay  Procedure(s) Performed: Right knee arthroscopy, ACL reconstruction using tibialis anterior tendon allograft, and chondroplasty - Horris Latino to Assist (Right )  Patient location during evaluation: PACU Anesthesia Type: Regional Level of consciousness: awake and alert Pain management: pain level controlled Vital Signs Assessment: post-procedure vital signs reviewed and stable Respiratory status: spontaneous breathing and respiratory function stable Cardiovascular status: stable Anesthetic complications: no   No complications documented.   Last Vitals:  Vitals:   05/06/20 1500 05/06/20 1506  BP:    Pulse: 80 79  Resp: (!) 9 16  Temp:    SpO2: 99% 91%    Last Pain:  Vitals:   05/06/20 1500  TempSrc:   PainSc: Asleep                 Wilbert Schouten K

## 2020-05-06 NOTE — Anesthesia Preprocedure Evaluation (Addendum)
Anesthesia Evaluation  Patient identified by MRN, date of birth, ID band Patient awake    Reviewed: Allergy & Precautions, NPO status , Patient's Chart, lab work & pertinent test results  History of Anesthesia Complications (+) PROLONGED EMERGENCE and history of anesthetic complications  Airway Mallampati: II       Dental   Pulmonary neg sleep apnea, neg COPD, Not current smoker,           Cardiovascular (-) hypertension(-) Past MI and (-) CHF (-) dysrhythmias (-) Valvular Problems/Murmurs     Neuro/Psych neg Seizures Anxiety    GI/Hepatic Neg liver ROS, GERD  ,  Endo/Other  neg diabetes  Renal/GU negative Renal ROS     Musculoskeletal   Abdominal   Peds  Hematology   Anesthesia Other Findings   Reproductive/Obstetrics                             Anesthesia Physical Anesthesia Plan  ASA: II  Anesthesia Plan: General   Post-op Pain Management: GA combined w/ Regional for post-op pain   Induction: Intravenous  PONV Risk Score and Plan: 3 and Ondansetron, Dexamethasone and Midazolam  Airway Management Planned: Oral ETT  Additional Equipment:   Intra-op Plan:   Post-operative Plan:   Informed Consent: I have reviewed the patients History and Physical, chart, labs and discussed the procedure including the risks, benefits and alternatives for the proposed anesthesia with the patient or authorized representative who has indicated his/her understanding and acceptance.       Plan Discussed with:   Anesthesia Plan Comments:        Anesthesia Quick Evaluation

## 2020-05-06 NOTE — Op Note (Addendum)
Operative Note    SURGERY DATE: 05/06/2020   PRE-OP DIAGNOSIS:  1. Right ACL tear  POST-OP DIAGNOSIS:  1. Right ACL tear  PROCEDURES:  1.  Right knee arthroscopic assisted anterior cruciate ligament reconstruction with tibialis anterior allograft  SURGEON: Rosealee Albee, MD  ASSISTANT: Valeria Batman, PA   ANESTHESIA: Gen    ESTIMATED BLOOD LOSS: 5cc   TOTAL IV FLUIDS: per anesthesia  INDICATION(S):  The patient is a 54 y.o. female who initially had a twisting injury to her knee approximately 2 months ago.  There was an immediate pop and a large effusion in the knee. MRI and clinical examination were both consistent with ACL tear. She had persistent sensations of instability with daily activities. After discussion of risks, benefits, and alternatives, the patient elected to proceed with surgery.   OPERATIVE FINDINGS:    Examination under anesthesia: A careful examination under anesthesia was performed.  Passive range of motion was: Hyperextension: 2.  Extension: 0.  Flexion: 125.  Lachman: 2B. Pivot Shift: grade 2.  Posterior drawer: normal.  Varus stability in full extension: normal.  Varus stability in 30 degrees of flexion: normal.  Valgus stability in full extension: normal.  Valgus stability in 30 degrees of flexion: normal.   Intra-operative findings: A thorough arthroscopic examination of the knee was performed.  The findings are: 1. Suprapatellar pouch: Normal 2. Undersurface of median ridge: Grade 2 softening 3. Medial patellar facet: Grade 1 softening 4. Lateral patellar facet: Grade 1 softening 5. Trochlea: Normal 6. Lateral gutter/popliteus tendon: Normal 7. Hoffa's fat pad: Normal 8. Medial gutter/plica: Normal 9. ACL: Abnormal: complete femoral avulsion 10. PCL: Normal 11. Medial meniscus: Normal, no RAMP lesion 12. Medial compartment cartilage: Grade 1 softening of femoral condyle, Grade 2 changes to tibial plateau 13. Lateral meniscus: Normal 14. Lateral  compartment cartilage: Grade 1 softening of femoral condyle   OPERATIVE REPORT:     I identified Mariah Mckay in the pre-operative holding area.  I marked the operative knee with my initials. I reviewed the risks and benefits of the proposed surgical intervention and the patient wished to proceed. The patient was transferred to the operative suite and placed in the supine position with all bony prominences padded.  Anesthesia was administered. Appropriate IV antibiotics were administered. The extremity was then prepped and draped in standard fashion. A time out was performed confirming the correct extremity, correct patient and correct procedure. Given the clear presence of a pivot shift and Lachman's on examination under anesthesia, we elected to proceed with ACL reconstruction as planned.  An Esmarch bandage was placed about the leg to exsanguinated and the tourniquet was inflated to 250 mmHg standard anterolateral portals was created with an 11-blade. The arthroscope was introduced through the anterolateral portal, and a full diagnostic arthroscopy was performed as described above.  An anteromedial portal was made under needle localization. A shaver was introduced through the anteromedial portal and used to gently debride the fat pad to improve visualization.    Then, the ACL remnant was debrided using the shaver, leaving 1-2 mm stumps on the tibia for anatomic referencing.  A tibialis anterior allograft measuring 9 mm in width when doubled over was utilized.  A tight rope RT button was looped around the midportion of the graft when doubled over.  The tails were secured with a fiber loop suture. Graft could be passed through 9.2mm tube.  Next, I created the femoral socket. This was performed with an outside-in technique using  an Teacher, English as a foreign language. The femoral guide indicated where the lateral femoral incision should be. We made this incision with a 11 blade and followed the angle of the drill  sleeve to make the incision through the IT band down to bone. The drill sleeve was pushed down to bone on the lateral femoral condyle and the guide was placed on the anatomic footprint of the ACL. We drilled an 9.28mm tunnel that was 65mm in length. We then used a FiberStick to pass a suture through the femoral tunnel and out of the anteromedial portal.    I then directed my attention to preparation of the tibial tunnel. A tibial guide set at 60 degrees was inserted through the anteromedial portal and centered over the tibial footprint.  The drill sleeve was then advanced to the proximal medial tibia just at the junction of the tibial tubercle and the pes tendon, through a ~4cm incision.  The anticipated tunnel length was 44mm.  A guide pin was then drilled through the proximal tibia under direct arthroscopic visualization into the center of the ACL footprint.  This was then over-reamed with an Arthrex 9.77mm reamer.  Bony debris and soft tissue was cleared from the metaphyseal opening with electrocautery and from the intra-articular aperture with a shaver.   The graft was then advanced into place in standard fashion.  The femoral Tight Rope was deployed on the lateral cortex under direct arthroscopic visualization from the anteromedial portal.  Correct position on the lateral cortex was confirmed fluoroscopically.  The TightRope was then shortened until 30mm of graft was in the femoral tunnel.     I then directed my attention to tibial fixation.  This was performed with the knee in full extension with an axial and posterior drawer load applied to the tibia.  A 9x20 mm BioComposite tibial screw was placed anterior to the graft in the tibial tunnel. The knee was then cycled 20 times, and the femoral tightrope was re-tightened as much as possible with the knee in full extension.    A repeat examination under anesthesia was performed.  The patient retained full extension and flexion to 90 degrees.  The Lachman's  was normalized.  There was no residual pivot shift.  The arthroscope was re-introduced into the knee joint, confirming excellent position and tension of the ACL graft.  There was no lateral wall or roof impingement.    The subdermal layer was closed with 2-0 Vicryl, and 4-0 Monocryl was used for skin.  The arthroscopy portals and lateral femoral incision were closed with 3-0 Nylon.  A sterile dressing was applied. The tourniquet was released after 87 minutes. PolarCare device and a hinged knee brace locked in full extension were applied.   The patient was awakened from anesthesia without difficulty and was transferred to the PACU in stable condition.  Of note, assistance from a PA was essential to performing the surgery.  PA was present for the entire surgery.  PA assisted with patient positioning, retraction, instrumentation, and wound closure. The surgery would have been more difficult and had longer operative time without PA assistance.       POSTOPERATIVE PLAN: The patient will be discharged home today once they meet PACU criteria.  Start aspirin 325 mg daily for 2 weeks for DVT prophylaxis. Physical therapy on POD#3-4 per ACL reconstruction protocol.  WBAT with leg locked in extension. Follow up in 2 weeks per protocol.

## 2020-05-06 NOTE — Anesthesia Procedure Notes (Addendum)
Anesthesia Regional Block: Femoral nerve block   Pre-Anesthetic Checklist: ,, timeout performed, Correct Patient, Correct Site, Correct Laterality, Correct Procedure, Correct Position, site marked, Risks and benefits discussed,  Surgical consent,  Pre-op evaluation,  At surgeon's request and post-op pain management  Laterality: Right  Prep: chloraprep       Needles:  Injection technique: Single-shot  Needle Type: Echogenic Needle     Needle Length: 9cm  Needle Gauge: 21     Additional Needles:   Procedures:,,,, ultrasound used (permanent image in chart),,,,  Narrative:  Start time: 05/06/2020 11:42 AM End time: 05/06/2020 11:43 AM Injection made incrementally with aspirations every 5 mL.  Performed by: Personally  Anesthesiologist: Piscitello, Cleda Mccreedy, MD  Additional Notes: Patient consented for risk and benefits of nerve block including but not limited to nerve damage, failed block, bleeding and infection.  Patient voiced understanding.  Functioning IV was confirmed and monitors were applied.  Timeout done prior to procedure and prior to any sedation being given to the patient.  Patient confirmed procedure site prior to any sedation given to the patient.  A 35mm 22ga Stimuplex needle was used.   Minimal sedation used for procedure.  No paresthesia endorsed by patient during the procedure.  Negative aspiration and negative test dose prior to incremental administration of local anesthetic. The patient tolerated the procedure well with no immediate complications.

## 2020-05-07 ENCOUNTER — Encounter: Payer: Self-pay | Admitting: Orthopedic Surgery

## 2020-07-03 ENCOUNTER — Ambulatory Visit
Admission: RE | Admit: 2020-07-03 | Discharge: 2020-07-03 | Disposition: A | Discharge status: Home / Self Care | Payer: Managed Care, Other (non HMO) | Source: Ambulatory Visit | Attending: Nurse Practitioner | Admitting: Nurse Practitioner

## 2020-07-03 ENCOUNTER — Encounter: Payer: Self-pay | Admitting: Nurse Practitioner

## 2020-07-03 ENCOUNTER — Ambulatory Visit: Payer: Managed Care, Other (non HMO) | Admitting: Nurse Practitioner

## 2020-07-03 ENCOUNTER — Ambulatory Visit
Admission: RE | Admit: 2020-07-03 | Discharge: 2020-07-03 | Disposition: A | Discharge status: Home / Self Care | Payer: Managed Care, Other (non HMO) | Source: Ambulatory Visit | Attending: Family Medicine | Admitting: Family Medicine

## 2020-07-03 VITALS — BP 104/70 | HR 99 | Temp 97.4°F | Resp 16 | Ht 59.0 in | Wt 195.0 lb

## 2020-07-03 DIAGNOSIS — J189 Pneumonia, unspecified organism: Secondary | ICD-10-CM

## 2020-07-03 DIAGNOSIS — R509 Fever, unspecified: Secondary | ICD-10-CM

## 2020-07-03 DIAGNOSIS — R059 Cough, unspecified: Secondary | ICD-10-CM

## 2020-07-03 MED ORDER — BENZONATATE 200 MG PO CAPS: 200.0000 mg | ORAL_CAPSULE | Freq: Two times a day (BID) | ORAL | 0 refills | Status: DC | PRN

## 2020-07-03 MED ORDER — DOXYCYCLINE HYCLATE 100 MG PO TABS: 100.0000 mg | ORAL_TABLET | Freq: Two times a day (BID) | ORAL | 0 refills | Status: DC

## 2020-07-03 MED ORDER — HYDROCOD POLST-CPM POLST ER 10-8 MG/5ML PO SUER: 5.0000 mL | Freq: Two times a day (BID) | ORAL | 0 refills | Status: DC | PRN

## 2020-07-03 NOTE — Progress Notes (Signed)
Subjective:    Patient ID: Mariah Mckay, female    DOB: 19-Aug-1965, 55 y.o.   MRN: [id number]  HPI Mariah Mckay is a 55 y.o. female who works in [employer]. presents to the [facility] with c/o cough. The cough started 4 days ago. The cough has not been productive but feels like it deep in the lungs. It seems to be in spells, patient reports she will have coughing for a few minutes that is constant and then it stops and doesn't come back for a while. Fever past 2 days up to 101.4. She reports going to the health department 2 days ago and had a negative Covid test. She has not had a Covid vaccine but reports having Covid in mid Jan. With mild symptoms and was able to RTW after 5 days. She did have the flu vaccine this year. She states that she has had pneumonia and bronchitis in the past and wants to be sure she doesn't have it now. She has not taken anything for fever or for the cough. Patient concerned due to her mother dying with pulmonary fibrosis. Patient reports she has never smoked or use tobacco products, she does not have a hx of asthma or other chronic respiratory problems. Patient denies chest pain or SOB.   Past Medical History:  Diagnosis Date  . Cervical dysplasia   . Complication of anesthesia   . GERD (gastroesophageal reflux disease)   . Hypercholesteremia   . Pneumonia   . Pre-diabetes    Past Surgical History:  Procedure Laterality Date  . ACHILLES TENDON SURGERY    . KNEE ARTHROSCOPY WITH ANTERIOR CRUCIATE LIGAMENT (ACL) REPAIR WITH HAMSTRING GRAFT Right 05/06/2020   Procedure: Right knee arthroscopy, ACL reconstruction using tibialis anterior tendon allograft, and chondroplasty - Horris Latino to Assist;  Surgeon: Signa Kell, MD;  Location: [facility] ORS;  Service: Orthopedics;  Laterality: Right;  Horris Latino to Assist  . LEEP     Current Outpatient Medications on File Prior to Visit  Medication Sig Dispense Refill  . Cholecalciferol (VITAMIN D) 50 MCG (2000 UT)  tablet Take 2,000 Units by mouth 3 (three) times daily.    . Multiple Vitamin (MULTIVITAMIN) tablet Take 2 tablets by mouth daily.    . rosuvastatin (CRESTOR) 5 MG tablet Take 5 mg by mouth daily.    . vitamin B-12 (CYANOCOBALAMIN) 500 MCG tablet Take 500 mcg by mouth 2 (two) times daily.    . vitamin C (ASCORBIC ACID) 250 MG tablet Take 250 mg by mouth 3 (three) times daily.     No current facility-administered medications on file prior to visit.    Allergies  Allergen Reactions  . Shellfish Allergy Nausea And Vomiting    Review of Systems  Constitutional: Positive for chills and fever.  HENT: Positive for congestion and postnasal drip. Negative for ear pain, sore throat and trouble swallowing.   Eyes: Negative for pain, discharge and redness.  Respiratory: Positive for cough. Negative for chest tightness and shortness of breath. Wheezing: occasional.   Cardiovascular: Negative for chest pain, palpitations and leg swelling.  Gastrointestinal: Positive for nausea. Negative for abdominal pain, diarrhea and vomiting.  Genitourinary: Negative for dysuria and frequency.  Musculoskeletal: Positive for myalgias. Negative for gait problem and neck stiffness.  Skin: Negative for rash.  Neurological: Negative for dizziness, syncope and headaches.  Hematological: Negative for adenopathy.  Psychiatric/Behavioral: Negative for confusion. The patient is not nervous/anxious.        Objective: BP  104/70 (BP Location: Right Arm, Patient Position: Sitting, Cuff Size: Normal)   Pulse 99   Temp (!) 97.4 F (36.3 C) (Temporal)   Resp 16   Ht 4\' 11"  (1.499 m)   Wt 195 lb (88.5 kg)   SpO2 99%   BMI 39.39 kg/m     Physical Exam Vitals and nursing note reviewed.  Constitutional:      General: She is not in acute distress. HENT:     Head: Normocephalic.     Right Ear: Tympanic membrane, ear canal and external ear normal.     Left Ear: Tympanic membrane, ear canal and external ear normal.      Nose: Congestion present.     Mouth/Throat:     Mouth: Mucous membranes are moist.     Pharynx: Oropharynx is clear.  Eyes:     Extraocular Movements: Extraocular movements intact.     Conjunctiva/sclera: Conjunctivae normal.  Cardiovascular:     Rate and Rhythm: Normal rate and regular rhythm.     Heart sounds: No murmur heard.   Pulmonary:     Effort: Pulmonary effort is normal. No respiratory distress.     Breath sounds: Rales (RRL) present. No wheezing.  Abdominal:     General: There is no distension.     Tenderness: There is no abdominal tenderness. There is no right CVA tenderness or left CVA tenderness.  Musculoskeletal:        General: Normal range of motion.     Cervical back: Neck supple.     Right lower leg: No edema.     Left lower leg: No edema.  Skin:    General: Skin is warm and dry.  Neurological:     General: No focal deficit present.     Mental Status: She is alert.  Psychiatric:        Mood and Affect: Mood normal.        Thought Content: Thought content normal.        Judgment: Judgment normal.   Discussed with the patient clinical findings concerning for RLL pneumonia. Will send patient for x-ray and treat accordingly.     Assessment & Plan:  1. Cough with fever - DG Chest 2 View  2. Pneumonia of right lower lobe due to infectious organism - chlorpheniramine-HYDROcodone (TUSSIONEX) 10-8 MG/5ML SUER; Take 5 mLs by mouth every 12 (twelve) hours as needed for cough.  Dispense: 115 mL; Refill: 0 - doxycycline (VIBRA-TABS) 100 MG tablet; Take 1 tablet (100 mg total) by mouth 2 (two) times daily.  Dispense: 20 tablet; Refill: 0 - benzonatate (TESSALON) 200 MG capsule; Take 1 capsule (200 mg total) by mouth 2 (two) times daily as needed for cough.  Dispense: 20 capsule; Refill: 0  Discussed with patient f/u in 2 weeks or sooner if needed.

## 2020-07-03 NOTE — Patient Instructions (Signed)

## 2020-07-08 ENCOUNTER — Telehealth: Payer: Managed Care, Other (non HMO) | Admitting: Nurse Practitioner

## 2020-07-08 DIAGNOSIS — Z0289 Encounter for other administrative examinations: Secondary | ICD-10-CM

## 2020-07-08 DIAGNOSIS — Z8701 Personal history of pneumonia (recurrent): Secondary | ICD-10-CM | POA: Diagnosis not present

## 2020-07-08 MED ORDER — ALBUTEROL SULFATE HFA 108 (90 BASE) MCG/ACT IN AERS: 2.0000 | INHALATION_SPRAY | Freq: Four times a day (QID) | RESPIRATORY_TRACT | 2 refills | Status: DC | PRN

## 2020-07-08 MED ORDER — PREDNISONE 20 MG PO TABS: 20.0000 mg | ORAL_TABLET | Freq: Two times a day (BID) | ORAL | 0 refills | Status: DC

## 2020-07-08 NOTE — Progress Notes (Signed)
Mariah Mckay is a 55 y.o. female who was evaluated in the [facility] last week for cough, fever and congestion. Her CXR at that visit showed a RLL pneumonia. Patient called today and reports she is feeling better but is wheezing and still coughing. She did take her cough medication once but it made her sleepy so she hasn't taken it again.  Past Medical History:  Diagnosis Date  . Cervical dysplasia   . Complication of anesthesia   . GERD (gastroesophageal reflux disease)   . Hypercholesteremia   . Pneumonia   . Pre-diabetes    Current Outpatient Medications on File Prior to Visit  Medication Sig Dispense Refill  . benzonatate (TESSALON) 200 MG capsule Take 1 capsule (200 mg total) by mouth 2 (two) times daily as needed for cough. 20 capsule 0  . chlorpheniramine-HYDROcodone (TUSSIONEX) 10-8 MG/5ML SUER Take 5 mLs by mouth every 12 (twelve) hours as needed for cough. 115 mL 0  . Cholecalciferol (VITAMIN D) 50 MCG (2000 UT) tablet Take 2,000 Units by mouth 3 (three) times daily.    Marland Kitchen doxycycline (VIBRA-TABS) 100 MG tablet Take 1 tablet (100 mg total) by mouth 2 (two) times daily. 20 tablet 0  . Multiple Vitamin (MULTIVITAMIN) tablet Take 2 tablets by mouth daily.    . rosuvastatin (CRESTOR) 5 MG tablet Take 5 mg by mouth daily.    . vitamin B-12 (CYANOCOBALAMIN) 500 MCG tablet Take 500 mcg by mouth 2 (two) times daily.    . vitamin C (ASCORBIC ACID) 250 MG tablet Take 250 mg by mouth 3 (three) times daily.     No current facility-administered medications on file prior to visit.   Allergies  Allergen Reactions  . Shellfish Allergy Nausea And Vomiting   Observation: patient alert and oriented, speaking in full sentences. Reports no fever and feeling some better.  Plan: discussed with the patient that I will send Rx for prednisone and albuterol inhaler. She will use a vaporizer and take 1/2 dose of her cough medication at night. She will call or return as needed for any problems.

## 2020-07-17 ENCOUNTER — Ambulatory Visit: Payer: Managed Care, Other (non HMO) | Admitting: Nurse Practitioner

## 2020-07-17 ENCOUNTER — Other Ambulatory Visit: Payer: Self-pay

## 2020-07-17 VITALS — BP 110/78 | HR 90 | Temp 98.7°F | Resp 15 | Ht 59.0 in | Wt 195.0 lb

## 2020-07-17 DIAGNOSIS — Z09 Encounter for follow-up examination after completed treatment for conditions other than malignant neoplasm: Secondary | ICD-10-CM

## 2020-07-17 DIAGNOSIS — K219 Gastro-esophageal reflux disease without esophagitis: Secondary | ICD-10-CM | POA: Diagnosis not present

## 2020-07-17 MED ORDER — FLUTICASONE PROPIONATE 50 MCG/ACT NA SUSP: 2.0000 | Freq: Every day | NASAL | 6 refills | Status: DC

## 2020-07-17 NOTE — Progress Notes (Signed)
   Subjective:    Patient ID: Mariah Mckay, female    DOB: February 02, 1966, 55 y.o.   MRN: [id number]  HPI Mariah Mckay is a 55 y.o. female who presents to the [facility] for f/u of RLL pneumonia. She has completed her treatment of antibiotics and steroids. She states she is feeling much better but has a lingering cough and some nasal congestion. She also reports reflux that improves with taking Tums. Patient denies fever, chills, n/v/d or other problems. Patient reports she did not take her Tessalon for cough.    Review of Systems  HENT: Positive for congestion.   Respiratory: Positive for cough.   All other systems reviewed and are negative.      Objective: BP 110/78 (BP Location: Left Arm, Patient Position: Sitting, Cuff Size: Large)   Pulse 90   Temp 98.7 F (37.1 C) (Oral)   Resp 15   Ht 4\' 11"  (1.499 m)   Wt 195 lb (88.5 kg)   SpO2 95%   BMI 39.39 kg/m     Physical Exam Vitals and nursing note reviewed.  Constitutional:      General: She is not in acute distress.    Appearance: Normal appearance.  HENT:     Head: Normocephalic.     Right Ear: External ear normal.     Left Ear: External ear normal.  Eyes:     Extraocular Movements: Extraocular movements intact.     Conjunctiva/sclera: Conjunctivae normal.  Cardiovascular:     Rate and Rhythm: Normal rate and regular rhythm.     Heart sounds: No murmur heard.   Pulmonary:     Effort: Pulmonary effort is normal. No respiratory distress.     Breath sounds: No stridor. No wheezing, rhonchi or rales.  Musculoskeletal:        General: Normal range of motion.     Cervical back: Neck supple.  Skin:    General: Skin is warm and dry.  Neurological:     Mental Status: She is alert.  Psychiatric:        Mood and Affect: Mood normal.        Behavior: Behavior normal.       Assessment & Plan:  55 y.o. female here for f/u of RLL pneumonia with improving symptoms. She has no respiratory distress, she speaks in full  sentences, no coughing during visit today. The rales in the RLL that was heard on the previous visit has resolved.  Encounter for follow-up examination  Gastroesophageal reflux disease without esophagitis patient to continue Tums but if needs additional tx she will try OTC pepcid. She will discuss with PCP if sx do not resolve.   Nasal congestion/ cough with clear sputum  Will Rx Flonase and patient will use Tessalon as needed  In review of the patient's chart her problem list includes Vit. D deficiency. Last lab for Vit D level was 03/20/16 and was 29.1. Patient reports taking Vit D3, three times a day but has not had level checked since starting medication. Discussed that when she sees her PCP to discuss f/u.  RTC as needed.

## 2020-07-17 NOTE — Patient Instructions (Addendum)
Your lungs are clear today. Use the Tessalon cough pills as needed. Use the Flonase for nasal congestion. Be sure you are drinking plenty of fluids. Return here as needed.   Be sure to have your Vitamin D level rechecked on your next physical with your primary care doctor.  Vitamin D Deficiency Vitamin D deficiency is when your body does not have enough vitamin D. Vitamin D is important to your body because:  It helps your body use other minerals.  It helps to keep your bones strong and healthy.  It may help to prevent some diseases.  It helps your heart and other muscles work well. Not getting enough vitamin D can make your bones soft. It can also cause other health problems. What are the causes? This condition may be caused by:  Not eating enough foods that contain vitamin D.  Not getting enough sun.  Having diseases that make it hard for your body to absorb vitamin D.  Having a surgery in which a part of the stomach or a part of the small intestine is removed.  Having kidney disease or liver disease. What increases the risk? You are more likely to get this condition if:  You are older.  You do not spend much time outdoors.  You live in a nursing home.  You have had broken bones.  You have weak or thin bones (osteoporosis).  You have a disease or condition that changes how your body absorbs vitamin D.  You have dark skin.  You take certain medicines.  You are overweight or obese. What are the signs or symptoms?  In mild cases, there may not be any symptoms. If the condition is very bad, symptoms may include: ? Bone pain. ? Muscle pain. ? Falling often. ? Broken bones caused by a minor injury. How is this treated? Treatment may include taking supplements as told by your doctor. Your doctor will tell you what dose is best for you. Supplements may include:  Vitamin D.  Calcium. Follow these instructions at home: Eating and drinking  Eat foods that contain  vitamin D, such as: ? Dairy products, cereals, or juices with added vitamin D. Check the label. ? Fish, such as salmon or trout. ? Eggs. ? Oysters. ? Mushrooms. The items listed above may not be a complete list of what you can eat and drink. Contact a dietitian for more options.   General instructions  Take medicines and supplements only as told by your doctor.  Get regular, safe exposure to natural sunlight.  Do not use a tanning bed.  Maintain a healthy weight. Lose weight if needed.  Keep all follow-up visits as told by your doctor. This is important. How is this prevented?  You can get vitamin D by: ? Eating foods that naturally contain vitamin D. ? Eating or drinking products that have vitamin D added to them, such as cereals, juices, and milk. ? Taking vitamin D or a multivitamin that contains vitamin D. ? Being in the sun. Your body makes vitamin D when your skin is exposed to sunlight. Your body changes the sunlight into a form of the vitamin that it can use. Contact a doctor if:  Your symptoms do not go away.  You feel sick to your stomach (nauseous).  You throw up (vomit).  You poop less often than normal, or you have trouble pooping (constipation). Summary  Vitamin D deficiency is when your body does not have enough vitamin D.  Vitamin D helps  to keep your bones strong and healthy.  This condition is often treated by taking a supplement.  Your doctor will tell you what dose is best for you. This information is not intended to replace advice given to you by your health care provider. Make sure you discuss any questions you have with your health care provider. Document Revised: 01/03/2018 Document Reviewed: 01/03/2018 Elsevier Patient Education  2021 [employer].

## 2020-09-11 ENCOUNTER — Ambulatory Visit: Payer: Managed Care, Other (non HMO) | Admitting: Internal Medicine

## 2020-09-11 ENCOUNTER — Other Ambulatory Visit: Payer: Self-pay

## 2020-09-11 ENCOUNTER — Encounter: Payer: Self-pay | Admitting: Internal Medicine

## 2020-09-11 VITALS — BP 115/78 | HR 81 | Temp 98.3°F | Ht 58.47 in | Wt 190.6 lb

## 2020-09-11 DIAGNOSIS — E669 Obesity, unspecified: Secondary | ICD-10-CM

## 2020-09-11 DIAGNOSIS — R600 Localized edema: Secondary | ICD-10-CM | POA: Diagnosis not present

## 2020-09-11 DIAGNOSIS — Z1239 Encounter for other screening for malignant neoplasm of breast: Secondary | ICD-10-CM

## 2020-09-11 DIAGNOSIS — Z13820 Encounter for screening for osteoporosis: Secondary | ICD-10-CM

## 2020-09-11 DIAGNOSIS — Z1211 Encounter for screening for malignant neoplasm of colon: Secondary | ICD-10-CM | POA: Diagnosis not present

## 2020-09-11 DIAGNOSIS — Z23 Encounter for immunization: Secondary | ICD-10-CM

## 2020-09-11 NOTE — Addendum Note (Signed)
Addended by: Leward Quan A on: 09/11/2020 03:08 PM   Modules accepted: Orders

## 2020-09-11 NOTE — Progress Notes (Signed)
Ht 4' 10.47" (1.485 m)   Wt 190 lb 9.6 oz (86.5 kg)   BMI 39.20 kg/m    Subjective:    Patient ID: Mariah Mckay, female    DOB: 1965/06/13, 55 y.o.   MRN: [id number]  HPI: Mariah MONCEAUX is a 55 y.o. female  Pt is new to the practice, was seeming someone at the [place] [facility].  Has pneumonia x 3 , one time in the summer 20 yrs ago and the 2 nd time was  Has a ho bronchitis in the 90s/, pna x 3, HLD is on crestor sec to hereditary causes.  Breast cancer - had BRACA +ve has 5 sisters and mom - were -ve for braca  50  46   Obesity - has been taking optivia meal planning - x 4 weeks lost 7 lbs. Packed foods   Pedal edema : in rt lower ext    Chief Complaint  Patient presents with  . New Patient (Initial Visit)    Est. care    Relevant past medical, surgical, family and social history reviewed and updated as indicated. Interim medical history since our last visit reviewed. Allergies and medications reviewed and updated.  Review of Systems  Constitutional: Negative for activity change, appetite change, chills, fatigue and fever.  HENT: Negative for congestion, ear discharge, ear pain and facial swelling.   Eyes: Negative for pain and itching.  Respiratory: Negative for cough, chest tightness, shortness of breath and wheezing.   Cardiovascular: Negative for chest pain, palpitations and leg swelling.  Gastrointestinal: Negative for abdominal distention, abdominal pain, blood in stool, constipation, diarrhea, nausea and vomiting.  Endocrine: Negative for cold intolerance, heat intolerance, polydipsia, polyphagia and polyuria.  Genitourinary: Negative for difficulty urinating, dysuria, flank pain, frequency, hematuria and urgency.  Musculoskeletal: Negative for arthralgias, gait problem, joint swelling and myalgias.  Skin: Negative for color change, rash and wound.  Neurological: Negative for dizziness, tremors, speech difficulty, weakness, light-headedness, numbness and  headaches.  Hematological: Does not bruise/bleed easily.  Psychiatric/Behavioral: Negative for agitation, confusion, decreased concentration, sleep disturbance and suicidal ideas.    Per HPI unless specifically indicated above     Objective:    Ht 4' 10.47" (1.485 m)   Wt 190 lb 9.6 oz (86.5 kg)   BMI 39.20 kg/m   Wt Readings from Last 3 Encounters:  09/11/20 190 lb 9.6 oz (86.5 kg)  07/17/20 195 lb (88.5 kg)  07/03/20 195 lb (88.5 kg)    Physical Exam Vitals and nursing note reviewed.  Constitutional:      General: She is not in acute distress.    Appearance: Normal appearance. She is not ill-appearing or diaphoretic.  HENT:     Head: Normocephalic and atraumatic.     Right Ear: Tympanic membrane and external ear normal. There is no impacted cerumen.     Left Ear: External ear normal.     Nose: No congestion or rhinorrhea.     Mouth/Throat:     Pharynx: No oropharyngeal exudate or posterior oropharyngeal erythema.  Eyes:     Conjunctiva/sclera: Conjunctivae normal.     Pupils: Pupils are equal, round, and reactive to light.  Cardiovascular:     Rate and Rhythm: Normal rate and regular rhythm.     Heart sounds: No murmur heard. No friction rub. No gallop.   Pulmonary:     Effort: No respiratory distress.     Breath sounds: No stridor. No wheezing or rhonchi.  Chest:  Chest wall: No tenderness.  Abdominal:     General: Abdomen is flat. Bowel sounds are normal. There is no distension.     Palpations: Abdomen is soft. There is no mass.     Tenderness: There is no abdominal tenderness. There is no guarding.  Musculoskeletal:        General: No swelling, tenderness, deformity or signs of injury.     Cervical back: Normal range of motion and neck supple. No rigidity or tenderness.     Right lower leg: Edema present.     Left lower leg: No edema.  Skin:    General: Skin is warm and dry.     Coloration: Skin is not jaundiced.     Findings: No erythema.   Neurological:     Mental Status: She is alert and oriented to person, place, and time. Mental status is at baseline.  Psychiatric:        Mood and Affect: Mood normal.        Behavior: Behavior normal.        Thought Content: Thought content normal.        Judgment: Judgment normal.     Results for orders placed or performed during the hospital encounter of 05/02/20  SARS CORONAVIRUS 2 (TAT 6-24 HRS) Nasopharyngeal Nasopharyngeal Swab   Specimen: Nasopharyngeal Swab  Result Value Ref Range   SARS Coronavirus 2 NEGATIVE NEGATIVE        Current Outpatient Medications:  .  rosuvastatin (CRESTOR) 5 MG tablet, Take 5 mg by mouth daily., Disp: , Rfl:     Assessment & Plan:  1. Weight loss - stable, has tried phentermine in the past   2. Health Maintenance : Mammogram: started getting them in her 30's last one was about 2 yrs ago.2020 Paps smear: 2021 DEXA: never Cscope : never  Pneumonia vaccine :pneumovax today   3., rt ACL tear repaired dec 2021.  4. Rt lower ext edema : stable.   rtc for a physical and bw.  Problem List Items Addressed This Visit   None   Visit Diagnoses    Screen for colon cancer    -  Primary   Relevant Orders   Cologuard   Encounter for screening for malignant neoplasm of breast, unspecified screening modality       Relevant Orders   MM 3D SCREEN BREAST BILATERAL   Screening for osteoporosis       Relevant Orders   DG Bone Density       Follow up plan: No follow-ups on file.

## 2020-09-25 ENCOUNTER — Other Ambulatory Visit: Payer: Self-pay

## 2020-09-25 ENCOUNTER — Other Ambulatory Visit: Payer: Managed Care, Other (non HMO)

## 2020-09-25 DIAGNOSIS — R7303 Prediabetes: Secondary | ICD-10-CM

## 2020-09-25 DIAGNOSIS — Z1329 Encounter for screening for other suspected endocrine disorder: Secondary | ICD-10-CM

## 2020-09-25 DIAGNOSIS — E7800 Pure hypercholesterolemia, unspecified: Secondary | ICD-10-CM

## 2020-09-25 LAB — BAYER DCA HB A1C WAIVED: HB A1C (BAYER DCA - WAIVED): 6.1 % (ref <7.0)

## 2020-09-26 LAB — COMPREHENSIVE METABOLIC PANEL
ALT: 20 IU/L (ref 0–32)
AST: 17 IU/L (ref 0–40)
Albumin/Globulin Ratio: 2.1 (ref 1.2–2.2)
Albumin: 4.7 g/dL (ref 3.8–4.9)
Alkaline Phosphatase: 57 IU/L (ref 44–121)
BUN/Creatinine Ratio: 22 (ref 9–23)
BUN: 20 mg/dL (ref 6–24)
Bilirubin Total: 0.8 mg/dL (ref 0.0–1.2)
CO2: 21 mmol/L (ref 20–29)
Calcium: 9.6 mg/dL (ref 8.7–10.2)
Chloride: 103 mmol/L (ref 96–106)
Creatinine, Ser: 0.93 mg/dL (ref 0.57–1.00)
Globulin, Total: 2.2 g/dL (ref 1.5–4.5)
Glucose: 104 mg/dL — ABNORMAL HIGH (ref 65–99)
Potassium: 5 mmol/L (ref 3.5–5.2)
Sodium: 141 mmol/L (ref 134–144)
Total Protein: 6.9 g/dL (ref 6.0–8.5)
eGFR: 73 mL/min/{1.73_m2} (ref >59)

## 2020-09-26 LAB — CBC WITH DIFFERENTIAL/PLATELET
Basophils Absolute: 0 10*3/uL (ref 0.0–0.2)
Basos: 1 %
EOS (ABSOLUTE): 0.1 10*3/uL (ref 0.0–0.4)
Eos: 2 %
Hematocrit: 44.7 % (ref 34.0–46.6)
Hemoglobin: 14.8 g/dL (ref 11.1–15.9)
Immature Grans (Abs): 0 10*3/uL (ref 0.0–0.1)
Immature Granulocytes: 0 %
Lymphocytes Absolute: 2.7 10*3/uL (ref 0.7–3.1)
Lymphs: 42 %
MCH: 28 pg (ref 26.6–33.0)
MCHC: 33.1 g/dL (ref 31.5–35.7)
MCV: 85 fL (ref 79–97)
Monocytes Absolute: 0.5 10*3/uL (ref 0.1–0.9)
Monocytes: 7 %
Neutrophils Absolute: 3.1 10*3/uL (ref 1.4–7.0)
Neutrophils: 48 %
Platelets: 329 10*3/uL (ref 150–450)
RBC: 5.29 x10E6/uL — ABNORMAL HIGH (ref 3.77–5.28)
RDW: 13 % (ref 11.7–15.4)
WBC: 6.5 10*3/uL (ref 3.4–10.8)

## 2020-09-26 LAB — THYROID PANEL WITH TSH
Free Thyroxine Index: 1.7 (ref 1.2–4.9)
T3 Uptake Ratio: 25 % (ref 24–39)
T4, Total: 6.6 ug/dL (ref 4.5–12.0)
TSH: 0.937 u[IU]/mL (ref 0.450–4.500)

## 2020-09-26 LAB — LIPID PANEL
Chol/HDL Ratio: 3.7 ratio (ref 0.0–4.4)
Cholesterol, Total: 144 mg/dL (ref 100–199)
HDL: 39 mg/dL — ABNORMAL LOW (ref >39)
LDL Chol Calc (NIH): 85 mg/dL (ref 0–99)
Triglycerides: 109 mg/dL (ref 0–149)
VLDL Cholesterol Cal: 20 mg/dL (ref 5–40)

## 2020-10-02 ENCOUNTER — Encounter: Payer: Managed Care, Other (non HMO) | Admitting: Internal Medicine

## 2020-10-02 LAB — COLOGUARD: Cologuard: NEGATIVE

## 2020-10-10 ENCOUNTER — Encounter: Payer: Managed Care, Other (non HMO) | Admitting: Internal Medicine

## 2020-10-21 ENCOUNTER — Ambulatory Visit (INDEPENDENT_AMBULATORY_CARE_PROVIDER_SITE_OTHER): Payer: Managed Care, Other (non HMO) | Admitting: Internal Medicine

## 2020-10-21 ENCOUNTER — Encounter: Payer: Self-pay | Admitting: Internal Medicine

## 2020-10-21 ENCOUNTER — Other Ambulatory Visit: Payer: Self-pay

## 2020-10-21 VITALS — BP 108/68 | HR 92 | Temp 98.5°F | Ht 58.58 in | Wt 184.4 lb

## 2020-10-21 DIAGNOSIS — R7303 Prediabetes: Secondary | ICD-10-CM

## 2020-10-21 NOTE — Progress Notes (Signed)
BP 108/68   Pulse 92   Temp 98.5 F (36.9 C) (Oral)   Ht 4' 10.58" (1.488 m)   Wt 184 lb 6.4 oz (83.6 kg)   SpO2 96%   BMI 37.78 kg/m    Subjective:    Patient ID: Mariah Mckay, female    DOB: 08-05-1965, 55 y.o.   MRN: [id number]  Chief Complaint  Patient presents with   Annual Exam    HPI: Mariah Mckay is a 55 y.o. female  Pt Is here for a physical Is on 15 lbs x April 11th in 9 weeks. Is on a diet x goal weight to loose 60 lbs.    Chief Complaint  Patient presents with   Annual Exam    Relevant past medical, surgical, family and social history reviewed and updated as indicated. Interim medical history since our last visit reviewed. Allergies and medications reviewed and updated.  Review of Systems  Per HPI unless specifically indicated above     Objective:    BP 108/68   Pulse 92   Temp 98.5 F (36.9 C) (Oral)   Ht 4' 10.58" (1.488 m)   Wt 184 lb 6.4 oz (83.6 kg)   SpO2 96%   BMI 37.78 kg/m   Wt Readings from Last 3 Encounters:  10/21/20 184 lb 6.4 oz (83.6 kg)  09/11/20 190 lb 9.6 oz (86.5 kg)  07/17/20 195 lb (88.5 kg)    Physical Exam  Results for orders placed or performed in visit on 09/25/20  Lipid panel  Result Value Ref Range   Cholesterol, Total 144 100 - 199 mg/dL   Triglycerides 109 0 - 149 mg/dL   HDL 39 (L) >39 mg/dL   VLDL Cholesterol Cal 20 5 - 40 mg/dL   LDL Chol Calc (NIH) 85 0 - 99 mg/dL   Chol/HDL Ratio 3.7 0.0 - 4.4 ratio  CBC with Differential/Platelet  Result Value Ref Range   WBC 6.5 3.4 - 10.8 x10E3/uL   RBC 5.29 (H) 3.77 - 5.28 x10E6/uL   Hemoglobin 14.8 11.1 - 15.9 g/dL   Hematocrit 44.7 34.0 - 46.6 %   MCV 85 79 - 97 fL   MCH 28.0 26.6 - 33.0 pg   MCHC 33.1 31.5 - 35.7 g/dL   RDW 13.0 11.7 - 15.4 %   Platelets 329 150 - 450 x10E3/uL   Neutrophils 48 Not Estab. %   Lymphs 42 Not Estab. %   Monocytes 7 Not Estab. %   Eos 2 Not Estab. %   Basos 1 Not Estab. %   Neutrophils Absolute 3.1 1.4 - 7.0  x10E3/uL   Lymphocytes Absolute 2.7 0.7 - 3.1 x10E3/uL   Monocytes Absolute 0.5 0.1 - 0.9 x10E3/uL   EOS (ABSOLUTE) 0.1 0.0 - 0.4 x10E3/uL   Basophils Absolute 0.0 0.0 - 0.2 x10E3/uL   Immature Granulocytes 0 Not Estab. %   Immature Grans (Abs) 0.0 0.0 - 0.1 x10E3/uL  Thyroid Panel With TSH  Result Value Ref Range   TSH 0.937 0.450 - 4.500 uIU/mL   T4, Total 6.6 4.5 - 12.0 ug/dL   T3 Uptake Ratio 25 24 - 39 %   Free Thyroxine Index 1.7 1.2 - 4.9  Bayer DCA Hb A1c Waived  Result Value Ref Range   HB A1C (BAYER DCA - WAIVED) 6.1 <7.0 %  Comprehensive metabolic panel  Result Value Ref Range   Glucose 104 (H) 65 - 99 mg/dL   BUN 20 6 - 24 mg/dL  Creatinine, Ser 0.93 0.57 - 1.00 mg/dL   eGFR 73 >59 mL/min/1.73   BUN/Creatinine Ratio 22 9 - 23   Sodium 141 134 - 144 mmol/L   Potassium 5.0 3.5 - 5.2 mmol/L   Chloride 103 96 - 106 mmol/L   CO2 21 20 - 29 mmol/L   Calcium 9.6 8.7 - 10.2 mg/dL   Total Protein 6.9 6.0 - 8.5 g/dL   Albumin 4.7 3.8 - 4.9 g/dL   Globulin, Total 2.2 1.5 - 4.5 g/dL   Albumin/Globulin Ratio 2.1 1.2 - 2.2   Bilirubin Total 0.8 0.0 - 1.2 mg/dL   Alkaline Phosphatase 57 44 - 121 IU/L   AST 17 0 - 40 IU/L   ALT 20 0 - 32 IU/L        Current Outpatient Medications:    rosuvastatin (CRESTOR) 5 MG tablet, Take 5 mg by mouth daily., Disp: , Rfl:     Assessment & Plan:  Physical PHYSICAL :  Physical Wnl CMP, FLP, CBC,TSH  Anal fissures uses a cream for pain  discomfort   Obesity:  On a diet lost weight as above.  Lifestyle modifications advised to pt.   Portion control and avoiding high carb low fat diet advised.  Diet plan given to pt   exercise plan given and encouraged.  To increase exercise to 150 mins a week ie 21/2 hours a week. Pt verbalises understanding of the above.    Problem List Items Addressed This Visit   None    Follow up plan: No follow-ups on file.

## 2020-10-25 ENCOUNTER — Telehealth: Payer: Self-pay

## 2020-10-25 NOTE — Telephone Encounter (Signed)
Copied from CRM [id number]. Topic: General - Other >> Oct 25, 2020  3:44 PM Gwenlyn Fudge wrote: Reason for CRM: Pt called stating that she recently had a cologuard test. Pt states that she received a letter from cologuard stating that her results had been sent to PCP. She states that she was in earlier this week and nurse was supposed to be looking into this. She also states that she was advised her AWV notes were supposed to be sent to insurance company. Please advise.

## 2020-10-28 NOTE — Telephone Encounter (Signed)
Attempted to call patient, no answer left voicemail stating cologuard is negative. If any further questions can call office back.

## 2020-12-10 ENCOUNTER — Other Ambulatory Visit: Payer: Self-pay | Admitting: Internal Medicine

## 2020-12-10 DIAGNOSIS — Z1231 Encounter for screening mammogram for malignant neoplasm of breast: Secondary | ICD-10-CM

## 2020-12-13 ENCOUNTER — Other Ambulatory Visit: Payer: Self-pay

## 2020-12-13 ENCOUNTER — Ambulatory Visit
Admission: RE | Admit: 2020-12-13 | Discharge: 2020-12-13 | Disposition: A | Discharge status: Home / Self Care | Payer: Managed Care, Other (non HMO) | Source: Ambulatory Visit | Attending: Internal Medicine | Admitting: Internal Medicine

## 2020-12-13 DIAGNOSIS — Z1231 Encounter for screening mammogram for malignant neoplasm of breast: Secondary | ICD-10-CM | POA: Insufficient documentation

## 2020-12-17 ENCOUNTER — Encounter: Payer: Self-pay | Admitting: Family Medicine

## 2021-01-31 ENCOUNTER — Other Ambulatory Visit: Payer: Self-pay

## 2021-04-22 ENCOUNTER — Other Ambulatory Visit: Payer: Self-pay

## 2021-04-22 ENCOUNTER — Ambulatory Visit: Payer: Managed Care, Other (non HMO) | Admitting: Internal Medicine

## 2021-04-22 ENCOUNTER — Encounter: Payer: Self-pay | Admitting: Internal Medicine

## 2021-04-22 VITALS — BP 103/65 | HR 86 | Temp 98.7°F | Ht 58.66 in | Wt 186.8 lb

## 2021-04-22 DIAGNOSIS — E7800 Pure hypercholesterolemia, unspecified: Secondary | ICD-10-CM | POA: Diagnosis not present

## 2021-04-22 DIAGNOSIS — Z124 Encounter for screening for malignant neoplasm of cervix: Secondary | ICD-10-CM | POA: Insufficient documentation

## 2021-04-22 DIAGNOSIS — R7303 Prediabetes: Secondary | ICD-10-CM | POA: Diagnosis not present

## 2021-04-22 LAB — BAYER DCA HB A1C WAIVED: HB A1C (BAYER DCA - WAIVED): 6 % — ABNORMAL HIGH (ref 4.8–5.6)

## 2021-04-22 MED ORDER — ROSUVASTATIN CALCIUM 5 MG PO TABS: 5.0000 mg | ORAL_TABLET | Freq: Every day | ORAL | 5 refills | Status: DC

## 2021-04-22 NOTE — Progress Notes (Signed)
BP 103/65    Pulse 86    Temp 98.7 F (37.1 C) (Oral)    Ht 4' 10.66" (1.49 m)    Wt 186 lb 12.8 oz (84.7 kg)    SpO2 98%    BMI 38.17 kg/m    Subjective:    Patient ID: Mariah Mckay, female    DOB: 15-Dec-1965, 55 y.o.   MRN: [id number]  Chief Complaint  Patient presents with   Prediabetes   Hyperlipidemia    HPI: Mariah Mckay is a 55 y.o. female  Hyperlipidemia This is a chronic (recheck ldl is on crestor 5 mg daily. diet has been variable, lost 18 lbs in july and then diet got worse since thanksgivign got worse - is doing optivia packaged food and keeps bl sugars strtaight eat every 2 1/2 - 3 hrs) problem. The problem is controlled. Pertinent negatives include no chest pain, myalgias or shortness of breath.   Chief Complaint  Patient presents with   Prediabetes   Hyperlipidemia    Relevant past medical, surgical, family and social history reviewed and updated as indicated. Interim medical history since our last visit reviewed. Allergies and medications reviewed and updated.  Review of Systems  Constitutional:  Negative for activity change, appetite change, chills, fatigue and fever.  HENT:  Negative for congestion, ear discharge, ear pain and facial swelling.   Eyes:  Negative for pain and itching.  Respiratory:  Negative for cough, chest tightness, shortness of breath and wheezing.   Cardiovascular:  Negative for chest pain, palpitations and leg swelling.  Gastrointestinal:  Negative for abdominal distention, abdominal pain, blood in stool, constipation, diarrhea, nausea and vomiting.  Endocrine: Negative for cold intolerance, heat intolerance, polydipsia, polyphagia and polyuria.  Genitourinary:  Negative for difficulty urinating, dysuria, flank pain, frequency, hematuria and urgency.  Musculoskeletal:  Negative for arthralgias, gait problem, joint swelling and myalgias.  Skin:  Negative for color change, rash and wound.  Neurological:  Negative for  dizziness, tremors, speech difficulty, weakness, light-headedness, numbness and headaches.  Hematological:  Does not bruise/bleed easily.  Psychiatric/Behavioral:  Negative for agitation, confusion, decreased concentration, sleep disturbance and suicidal ideas.    Per HPI unless specifically indicated above     Objective:    BP 103/65    Pulse 86    Temp 98.7 F (37.1 C) (Oral)    Ht 4' 10.66" (1.49 m)    Wt 186 lb 12.8 oz (84.7 kg)    SpO2 98%    BMI 38.17 kg/m   Wt Readings from Last 3 Encounters:  04/22/21 186 lb 12.8 oz (84.7 kg)  10/21/20 184 lb 6.4 oz (83.6 kg)  09/11/20 190 lb 9.6 oz (86.5 kg)    Physical Exam Vitals and nursing note reviewed.  Constitutional:      General: She is not in acute distress.    Appearance: Normal appearance. She is not ill-appearing or diaphoretic.  HENT:     Head: Normocephalic and atraumatic.     Right Ear: Tympanic membrane and external ear normal. There is no impacted cerumen.     Left Ear: External ear normal.     Nose: No congestion or rhinorrhea.     Mouth/Throat:     Pharynx: No oropharyngeal exudate or posterior oropharyngeal erythema.  Eyes:     Conjunctiva/sclera: Conjunctivae normal.     Pupils: Pupils are equal, round, and reactive to light.  Cardiovascular:     Rate and Rhythm: Normal rate and regular rhythm.  Heart sounds: No murmur heard.   No friction rub. No gallop.  Pulmonary:     Effort: No respiratory distress.     Breath sounds: No stridor. No wheezing or rhonchi.  Chest:     Chest wall: No tenderness.  Abdominal:     General: Abdomen is flat. Bowel sounds are normal. There is no distension.     Palpations: Abdomen is soft. There is no mass.     Tenderness: There is no abdominal tenderness. There is no guarding.  Musculoskeletal:        General: No swelling or deformity.     Cervical back: Normal range of motion and neck supple. No rigidity or tenderness.     Right lower leg: No edema.     Left lower leg: No  edema.  Skin:    General: Skin is warm and dry.     Coloration: Skin is not jaundiced.     Findings: No erythema.  Neurological:     Mental Status: She is alert and oriented to person, place, and time. Mental status is at baseline.  Psychiatric:        Mood and Affect: Mood normal.        Behavior: Behavior normal.        Thought Content: Thought content normal.        Judgment: Judgment normal.   Results for orders placed or performed in visit on 10/28/20  Cologuard  Result Value Ref Range   Cologuard Negative Negative        Current Outpatient Medications:    rosuvastatin (CRESTOR) 5 MG tablet, Take 5 mg by mouth daily., Disp: , Rfl:     Assessment & Plan:  HLD  recheck FLP, check LFT's work on diet, SE of meds explained to pt. low fat and high fiber diet explained to pt.   2. Prediabetes Lifestyle modifications advised to pt. check a1c.   Portion control and avoiding high carb low fat diet advised.  Diet plan given to pt   exercise plan given and encouraged.  To increase exercise to 150 mins a week ie 21/2 hours a week. Pt verbalises understanding of the above.   Problem List Items Addressed This Visit   None Visit Diagnoses     Prediabetes    -  Primary   Relevant Orders   Bayer DCA Hb A1c Waived (STAT)        Orders Placed This Encounter  Procedures   Bayer DCA Hb A1c Waived (STAT)     No orders of the defined types were placed in this encounter.    Follow up plan: No follow-ups on file.

## 2021-05-16 ENCOUNTER — Encounter: Payer: Self-pay | Admitting: Obstetrics and Gynecology

## 2021-07-24 ENCOUNTER — Ambulatory Visit: Payer: Managed Care, Other (non HMO) | Admitting: Internal Medicine

## 2021-10-22 ENCOUNTER — Encounter: Payer: Self-pay | Admitting: Internal Medicine

## 2021-10-22 ENCOUNTER — Ambulatory Visit (INDEPENDENT_AMBULATORY_CARE_PROVIDER_SITE_OTHER): Payer: Managed Care, Other (non HMO) | Admitting: Internal Medicine

## 2021-10-22 ENCOUNTER — Other Ambulatory Visit: Payer: Self-pay

## 2021-10-22 VITALS — BP 110/70 | HR 86 | Temp 98.6°F | Ht 58.66 in | Wt 192.4 lb

## 2021-10-22 DIAGNOSIS — R7303 Prediabetes: Secondary | ICD-10-CM | POA: Diagnosis not present

## 2021-10-22 DIAGNOSIS — M79672 Pain in left foot: Secondary | ICD-10-CM

## 2021-10-22 DIAGNOSIS — K602 Anal fissure, unspecified: Secondary | ICD-10-CM | POA: Diagnosis not present

## 2021-10-22 DIAGNOSIS — E7800 Pure hypercholesterolemia, unspecified: Secondary | ICD-10-CM

## 2021-10-22 LAB — URINALYSIS, ROUTINE W REFLEX MICROSCOPIC
Bilirubin, UA: NEGATIVE
Glucose, UA: NEGATIVE
Ketones, UA: NEGATIVE
Nitrite, UA: NEGATIVE
Protein,UA: NEGATIVE
Specific Gravity, UA: >1.03 — ABNORMAL HIGH (ref 1.005–1.030)
Urobilinogen, Ur: 0.2 mg/dL (ref 0.2–1.0)
pH, UA: 6 (ref 5.0–7.5)

## 2021-10-22 LAB — MICROSCOPIC EXAMINATION
Bacteria, UA: NONE SEEN
Epithelial Cells (non renal): NONE SEEN /hpf (ref 0–10)

## 2021-10-22 LAB — BAYER DCA HB A1C WAIVED: HB A1C (BAYER DCA - WAIVED): 6.2 % — ABNORMAL HIGH (ref 4.8–5.6)

## 2021-10-22 MED ORDER — HYDROCORTISONE 0.5 % EX CREA: 1.0000 "application " | TOPICAL_CREAM | Freq: Two times a day (BID) | CUTANEOUS | 0 refills | Status: DC

## 2021-10-22 MED ORDER — ROSUVASTATIN CALCIUM 5 MG PO TABS: 5.0000 mg | ORAL_TABLET | Freq: Every day | ORAL | 1 refills | Status: DC

## 2021-10-22 NOTE — Progress Notes (Signed)
BP 110/70   Pulse 86   Temp 98.6 F (37 C) (Oral)   Ht 4' 10.66" (1.49 m)   Wt 192 lb 6.4 oz (87.3 kg)   SpO2 97%   BMI 39.31 kg/m    Subjective:    Patient ID: Mariah Mckay, female    DOB: 27-Feb-1966, 56 y.o.   MRN: [id number]  Chief Complaint  Patient presents with  . Annual Exam    HPI: Mariah Mckay is a 56 y.o. female  Foot Injury  The incident occurred more than 1 week ago (left foot pain was dancing at a wedding x april. is tender and is trying to walk on her break and feels the top of her left foot hurts no injury). The injury mechanism is unknown. The quality of the pain is described as aching. The pain is at a severity of 4/10. The pain is moderate. Pertinent negatives include no inability to bear weight, loss of motion, loss of sensation, muscle weakness, numbness or tingling. The treatment provided mild relief.    Chief Complaint  Patient presents with  . Annual Exam    Relevant past medical, surgical, family and social history reviewed and updated as indicated. Interim medical history since our last visit reviewed. Allergies and medications reviewed and updated.  Review of Systems  Neurological:  Negative for tingling and numbness.    Per HPI unless specifically indicated above     Objective:    BP 110/70   Pulse 86   Temp 98.6 F (37 C) (Oral)   Ht 4' 10.66" (1.49 m)   Wt 192 lb 6.4 oz (87.3 kg)   SpO2 97%   BMI 39.31 kg/m   Wt Readings from Last 3 Encounters:  10/22/21 192 lb 6.4 oz (87.3 kg)  04/22/21 186 lb 12.8 oz (84.7 kg)  10/21/20 184 lb 6.4 oz (83.6 kg)    Physical Exam Vitals and nursing note reviewed.  Constitutional:      General: She is not in acute distress.    Appearance: Normal appearance. She is not ill-appearing or diaphoretic.  Eyes:     Conjunctiva/sclera: Conjunctivae normal.  Pulmonary:     Effort: No respiratory distress.     Breath sounds: No stridor. No rhonchi.  Abdominal:     General: Abdomen is flat.  Bowel sounds are normal. There is no distension.     Palpations: Abdomen is soft. There is no mass.     Tenderness: There is no abdominal tenderness. There is no guarding.  Skin:    General: Skin is warm and dry.     Coloration: Skin is not jaundiced.     Findings: No erythema.  Neurological:     Mental Status: She is alert.    Results for orders placed or performed in visit on 10/22/21  Uric acid  Result Value Ref Range   Uric Acid 4.0 3.0 - 7.2 mg/dL  CBC with Differential/Platelet  Result Value Ref Range   WBC 7.4 3.4 - 10.8 x10E3/uL   RBC 5.01 3.77 - 5.28 x10E6/uL   Hemoglobin 14.5 11.1 - 15.9 g/dL   Hematocrit 42.2 34.0 - 46.6 %   MCV 84 79 - 97 fL   MCH 28.9 26.6 - 33.0 pg   MCHC 34.4 31.5 - 35.7 g/dL   RDW 12.8 11.7 - 15.4 %   Platelets 323 150 - 450 x10E3/uL   Neutrophils 53 Not Estab. %   Lymphs 39 Not Estab. %   Monocytes 6  Not Estab. %   Eos 1 Not Estab. %   Basos 1 Not Estab. %   Neutrophils Absolute 3.9 1.4 - 7.0 x10E3/uL   Lymphocytes Absolute 2.9 0.7 - 3.1 x10E3/uL   Monocytes Absolute 0.5 0.1 - 0.9 x10E3/uL   EOS (ABSOLUTE) 0.1 0.0 - 0.4 x10E3/uL   Basophils Absolute 0.0 0.0 - 0.2 x10E3/uL   Immature Granulocytes 0 Not Estab. %   Immature Grans (Abs) 0.0 0.0 - 0.1 x10E3/uL  Comprehensive metabolic panel  Result Value Ref Range   Glucose 118 (H) 70 - 99 mg/dL   BUN 23 6 - 24 mg/dL   Creatinine, Ser 1.07 (H) 0.57 - 1.00 mg/dL   eGFR 61 >59 mL/min/1.73   BUN/Creatinine Ratio 21 9 - 23   Sodium 141 134 - 144 mmol/L   Potassium 4.6 3.5 - 5.2 mmol/L   Chloride 104 96 - 106 mmol/L   CO2 24 20 - 29 mmol/L   Calcium 9.4 8.7 - 10.2 mg/dL   Total Protein 6.9 6.0 - 8.5 g/dL   Albumin 4.6 3.8 - 4.9 g/dL   Globulin, Total 2.3 1.5 - 4.5 g/dL   Albumin/Globulin Ratio 2.0 1.2 - 2.2   Bilirubin Total 0.6 0.0 - 1.2 mg/dL   Alkaline Phosphatase 58 44 - 121 IU/L   AST 18 0 - 40 IU/L   ALT 20 0 - 32 IU/L  Thyroid Panel With TSH  Result Value Ref Range   TSH 0.663  0.450 - 4.500 uIU/mL   T4, Total 6.8 4.5 - 12.0 ug/dL   T3 Uptake Ratio 27 24 - 39 %   Free Thyroxine Index 1.8 1.2 - 4.9  Lipid Profile  Result Value Ref Range   Cholesterol, Total 180 100 - 199 mg/dL   Triglycerides 131 0 - 149 mg/dL   HDL 45 >39 mg/dL   VLDL Cholesterol Cal 23 5 - 40 mg/dL   LDL Chol Calc (NIH) 112 (H) 0 - 99 mg/dL   Chol/HDL Ratio 4.0 0.0 - 4.4 ratio        Current Outpatient Medications:  .  hydrocortisone cream 0.5 %, Apply 1 application  topically 2 (two) times daily., Disp: 30 g, Rfl: 0 .  rosuvastatin (CRESTOR) 5 MG tablet, Take 1 tablet (5 mg total) by mouth daily., Disp: 90 tablet, Rfl: 1    Assessment & Plan:  Prediabetes: Lifestyle modifications advised to pt. A1c at 6 last visit.   Portion control and avoiding high carb low fat diet advised.  Diet plan given to pt   exercise plan given and encouraged.  To increase exercise to 150 mins a week ie 21/2 hours a week. Pt verbalises understanding of the above.   Left foot pain will check xrays of the affected joint.   Problem List Items Addressed This Visit       Digestive   Anal fissure   Relevant Medications   rosuvastatin (CRESTOR) 5 MG tablet   hydrocortisone cream 0.5 %   Other Relevant Orders   Comprehensive metabolic panel   Bayer DCA Hb A1c Waived (Completed)   Thyroid Panel With TSH   CBC with Differential/Platelet   Uric acid (Completed)   DG Foot Complete Left   CBC with Differential/Platelet (Completed)   Comprehensive metabolic panel (Completed)   Thyroid Panel With TSH (Completed)   Lipid Profile (Completed)     Other   Elevated cholesterol   Relevant Medications   rosuvastatin (CRESTOR) 5 MG tablet   hydrocortisone cream 0.5 %  Other Relevant Orders   Comprehensive metabolic panel   Bayer DCA Hb A1c Waived (Completed)   Thyroid Panel With TSH   CBC with Differential/Platelet   Uric acid (Completed)   DG Foot Complete Left   CBC with Differential/Platelet (Completed)    Comprehensive metabolic panel (Completed)   Thyroid Panel With TSH (Completed)   Lipid Profile (Completed)   Pre-diabetes - Primary   Relevant Medications   rosuvastatin (CRESTOR) 5 MG tablet   hydrocortisone cream 0.5 %   Other Relevant Orders   Comprehensive metabolic panel   Bayer DCA Hb A1c Waived (Completed)   Thyroid Panel With TSH   CBC with Differential/Platelet   Uric acid (Completed)   DG Foot Complete Left   CBC with Differential/Platelet (Completed)   Comprehensive metabolic panel (Completed)   Thyroid Panel With TSH (Completed)   Lipid Profile (Completed)   Other Visit Diagnoses     Foot pain, left       Relevant Medications   rosuvastatin (CRESTOR) 5 MG tablet   hydrocortisone cream 0.5 %   Other Relevant Orders   Comprehensive metabolic panel   Bayer DCA Hb A1c Waived (Completed)   Thyroid Panel With TSH   CBC with Differential/Platelet   Uric acid (Completed)   DG Foot Complete Left   CBC with Differential/Platelet (Completed)   Comprehensive metabolic panel (Completed)   Thyroid Panel With TSH (Completed)   Lipid Profile (Completed)        Orders Placed This Encounter  Procedures  . DG Foot Complete Left  . Comprehensive metabolic panel  . Bayer DCA Hb A1c Waived  . Thyroid Panel With TSH  . CBC with Differential/Platelet  . Uric acid  . CBC with Differential/Platelet  . Comprehensive metabolic panel  . Thyroid Panel With TSH  . Lipid Profile     Meds ordered this encounter  Medications  . rosuvastatin (CRESTOR) 5 MG tablet    Sig: Take 1 tablet (5 mg total) by mouth daily.    Dispense:  90 tablet    Refill:  1  . hydrocortisone cream 0.5 %    Sig: Apply 1 application  topically 2 (two) times daily.    Dispense:  30 g    Refill:  0     Follow up plan: Return in about 6 months (around 04/23/2022).

## 2021-10-23 ENCOUNTER — Other Ambulatory Visit: Payer: Managed Care, Other (non HMO)

## 2021-10-23 DIAGNOSIS — R7303 Prediabetes: Secondary | ICD-10-CM

## 2021-10-23 LAB — CBC WITH DIFFERENTIAL/PLATELET
Basophils Absolute: 0 10*3/uL (ref 0.0–0.2)
Basos: 1 %
EOS (ABSOLUTE): 0.1 10*3/uL (ref 0.0–0.4)
Eos: 1 %
Hematocrit: 42.2 % (ref 34.0–46.6)
Hemoglobin: 14.5 g/dL (ref 11.1–15.9)
Immature Grans (Abs): 0 10*3/uL (ref 0.0–0.1)
Immature Granulocytes: 0 %
Lymphocytes Absolute: 2.9 10*3/uL (ref 0.7–3.1)
Lymphs: 39 %
MCH: 28.9 pg (ref 26.6–33.0)
MCHC: 34.4 g/dL (ref 31.5–35.7)
MCV: 84 fL (ref 79–97)
Monocytes Absolute: 0.5 10*3/uL (ref 0.1–0.9)
Monocytes: 6 %
Neutrophils Absolute: 3.9 10*3/uL (ref 1.4–7.0)
Neutrophils: 53 %
Platelets: 323 10*3/uL (ref 150–450)
RBC: 5.01 x10E6/uL (ref 3.77–5.28)
RDW: 12.8 % (ref 11.7–15.4)
WBC: 7.4 10*3/uL (ref 3.4–10.8)

## 2021-10-23 LAB — URIC ACID: Uric Acid: 4 mg/dL (ref 3.0–7.2)

## 2021-10-23 LAB — THYROID PANEL WITH TSH
Free Thyroxine Index: 1.8 (ref 1.2–4.9)
T3 Uptake Ratio: 27 % (ref 24–39)
T4, Total: 6.8 ug/dL (ref 4.5–12.0)
TSH: 0.663 u[IU]/mL (ref 0.450–4.500)

## 2021-10-23 LAB — COMPREHENSIVE METABOLIC PANEL
ALT: 20 IU/L (ref 0–32)
AST: 18 IU/L (ref 0–40)
Albumin/Globulin Ratio: 2 (ref 1.2–2.2)
Albumin: 4.6 g/dL (ref 3.8–4.9)
Alkaline Phosphatase: 58 IU/L (ref 44–121)
BUN/Creatinine Ratio: 21 (ref 9–23)
BUN: 23 mg/dL (ref 6–24)
Bilirubin Total: 0.6 mg/dL (ref 0.0–1.2)
CO2: 24 mmol/L (ref 20–29)
Calcium: 9.4 mg/dL (ref 8.7–10.2)
Chloride: 104 mmol/L (ref 96–106)
Creatinine, Ser: 1.07 mg/dL — ABNORMAL HIGH (ref 0.57–1.00)
Globulin, Total: 2.3 g/dL (ref 1.5–4.5)
Glucose: 118 mg/dL — ABNORMAL HIGH (ref 70–99)
Potassium: 4.6 mmol/L (ref 3.5–5.2)
Sodium: 141 mmol/L (ref 134–144)
Total Protein: 6.9 g/dL (ref 6.0–8.5)
eGFR: 61 mL/min/{1.73_m2} (ref >59)

## 2021-10-24 LAB — LIPID PANEL
Chol/HDL Ratio: 4 ratio (ref 0.0–4.4)
Cholesterol, Total: 180 mg/dL (ref 100–199)
HDL: 45 mg/dL (ref >39)
LDL Chol Calc (NIH): 112 mg/dL — ABNORMAL HIGH (ref 0–99)
Triglycerides: 131 mg/dL (ref 0–149)
VLDL Cholesterol Cal: 23 mg/dL (ref 5–40)

## 2021-11-13 ENCOUNTER — Telehealth: Payer: Self-pay

## 2021-11-13 NOTE — Telephone Encounter (Signed)
Copied from CRM [id number]. Topic: General - Other >> Nov 13, 2021  8:15 AM Esperanza Sheets wrote: Reason for CRM: Patient states form sent back to [facility] did not have DOB filled in  Patient states she will fax the form back to [facility] for correction

## 2021-11-13 NOTE — Telephone Encounter (Signed)
Form was corrected and sent back to [facility].

## 2021-12-12 ENCOUNTER — Other Ambulatory Visit: Payer: Self-pay | Admitting: Internal Medicine

## 2021-12-12 ENCOUNTER — Telehealth: Payer: Self-pay

## 2021-12-12 DIAGNOSIS — Z1231 Encounter for screening mammogram for malignant neoplasm of breast: Secondary | ICD-10-CM

## 2021-12-12 NOTE — Telephone Encounter (Signed)
Copied from CRM [id number]. Topic: General - Other >> Dec 12, 2021  2:55 PM Dondra Prader E wrote: Reason for CRM: Pt needs new mammogram orders since Dr. Charlotta Newton has left, she was told to contact the office by [facility] clinic.  Best contact: [phone] or [phone] (work phone 8-5)

## 2021-12-15 NOTE — Telephone Encounter (Signed)
Patient needs new Mammogram order since Dr. Charlotta Newton is no longer here please.  Mammogram is tee'd up for your signature

## 2022-01-14 ENCOUNTER — Ambulatory Visit
Admission: RE | Admit: 2022-01-14 | Discharge: 2022-01-14 | Disposition: A | Discharge status: Home / Self Care | Payer: Managed Care, Other (non HMO) | Source: Ambulatory Visit | Attending: Nurse Practitioner | Admitting: Nurse Practitioner

## 2022-01-14 DIAGNOSIS — Z1231 Encounter for screening mammogram for malignant neoplasm of breast: Secondary | ICD-10-CM | POA: Diagnosis not present

## 2022-01-16 ENCOUNTER — Other Ambulatory Visit: Payer: Self-pay | Admitting: Nurse Practitioner

## 2022-01-16 DIAGNOSIS — N6489 Other specified disorders of breast: Secondary | ICD-10-CM

## 2022-01-16 DIAGNOSIS — R928 Other abnormal and inconclusive findings on diagnostic imaging of breast: Secondary | ICD-10-CM

## 2022-01-29 ENCOUNTER — Ambulatory Visit
Admission: RE | Admit: 2022-01-29 | Discharge: 2022-01-29 | Disposition: A | Discharge status: Home / Self Care | Payer: Managed Care, Other (non HMO) | Source: Ambulatory Visit | Attending: Nurse Practitioner | Admitting: Nurse Practitioner

## 2022-01-29 DIAGNOSIS — N6489 Other specified disorders of breast: Secondary | ICD-10-CM

## 2022-01-29 DIAGNOSIS — R928 Other abnormal and inconclusive findings on diagnostic imaging of breast: Secondary | ICD-10-CM

## 2022-02-03 ENCOUNTER — Telehealth: Payer: Self-pay | Admitting: Nurse Practitioner

## 2022-02-03 NOTE — Telephone Encounter (Signed)
Copied from [place] [id number] [id number]. Topic: General - Other >> Feb 03, 2022  3:44 PM Ja-Kwan M wrote: Reason for CRM: Pt reports that she was informed by her insurance that the form that was sent in with her date of birth missing still has not been received. Pt stated she sent the form in to [facility] and she would like for the form to be corrected and sent back to her insurance company. Cb# [phone]

## 2022-02-05 NOTE — Telephone Encounter (Signed)
Form has been resent to [employer] with the date of service added where it was missing

## 2022-02-19 ENCOUNTER — Telehealth: Payer: Self-pay | Admitting: Nurse Practitioner

## 2022-02-19 NOTE — Telephone Encounter (Signed)
Copied from [place] [id number]. Topic: Medical Record Request - Other >> Feb 18, 2022  4:52 PM Mariah Mckay wrote: Reason for CRM: The patient has called to request that a copy of the wellness form previously submitted to Mariah Mckay also be emailed to them   The form did not include a correct date initially and the patient wanted to confirm that an updated/corrected form has been resubmitted   The patient would like the form emailed to them at their email on file Mariah Mckay -[email or web address]  Please contact the patient further when possible

## 2022-02-24 NOTE — Telephone Encounter (Signed)
[facility] 

## 2022-03-23 IMAGING — MG MM DIGITAL SCREENING BILAT W/ TOMO AND CAD
6 of 10 series · 6 of 30 positions shown · non-contrast
Comparison: Previous exam(s).

CLINICAL DATA: Screening.

EXAM:
DIGITAL SCREENING BILATERAL MAMMOGRAM WITH TOMOSYNTHESIS AND CAD
TECHNIQUE: Bilateral screening digital craniocaudal and mediolateral oblique
mammograms were obtained. Bilateral screening digital breast
tomosynthesis was performed. The images were evaluated with
computer-aided detection.

[R MLO synth-2D (1 of 2)]
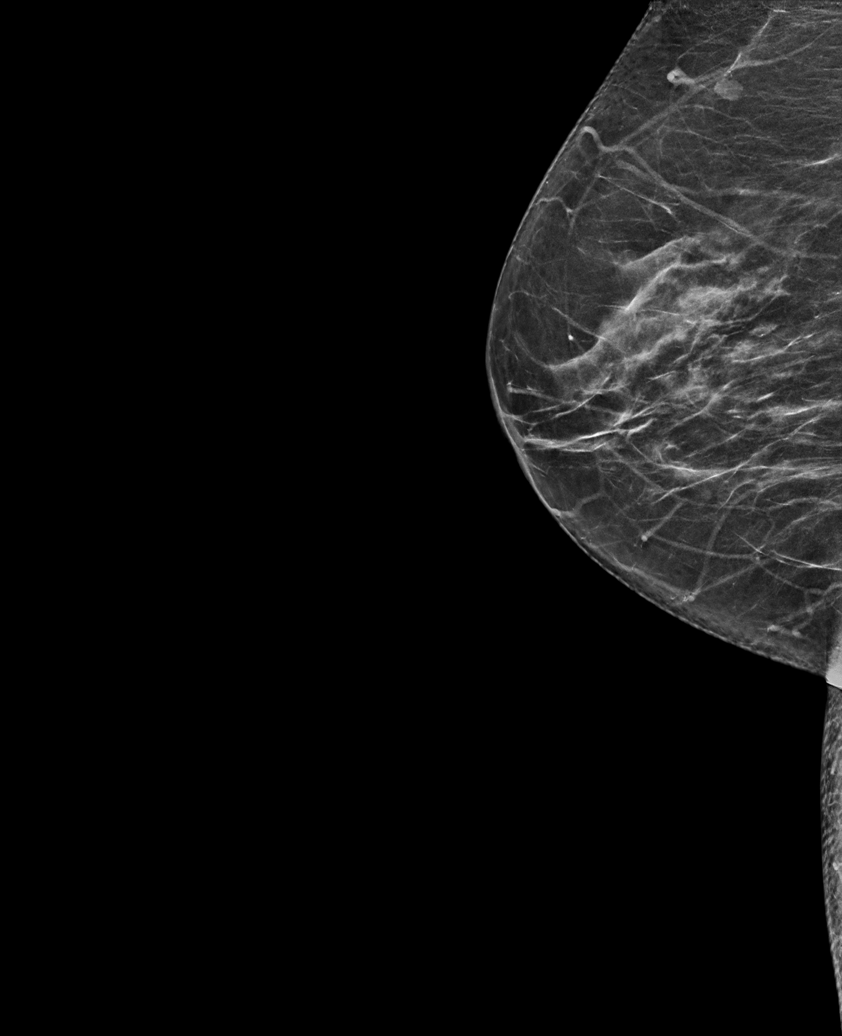

[R CC synth-2D]
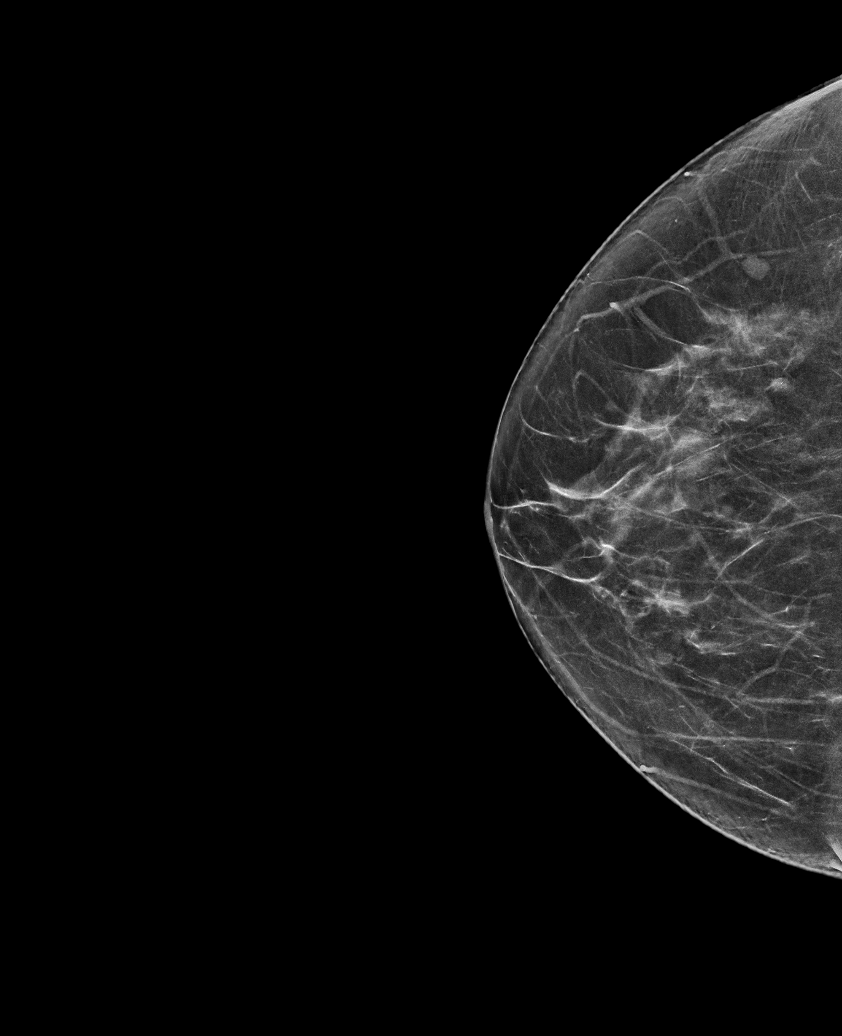

[L CC synth-2D]
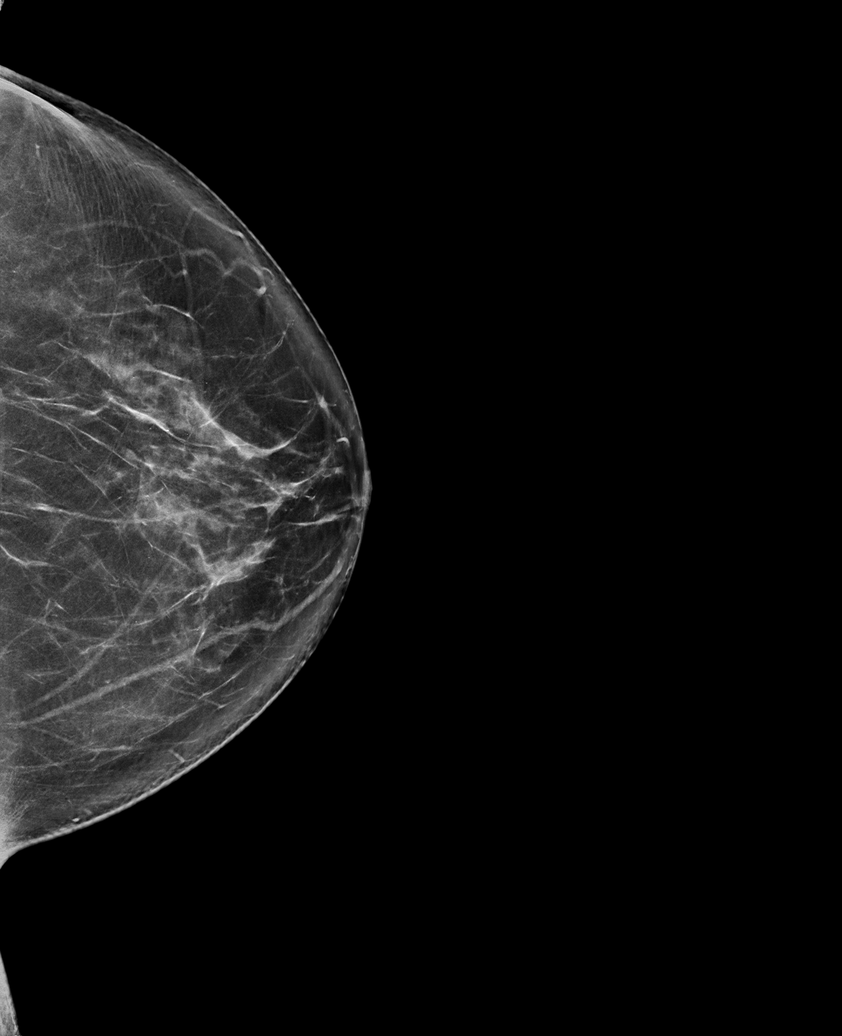

[L MLO synth-2D]
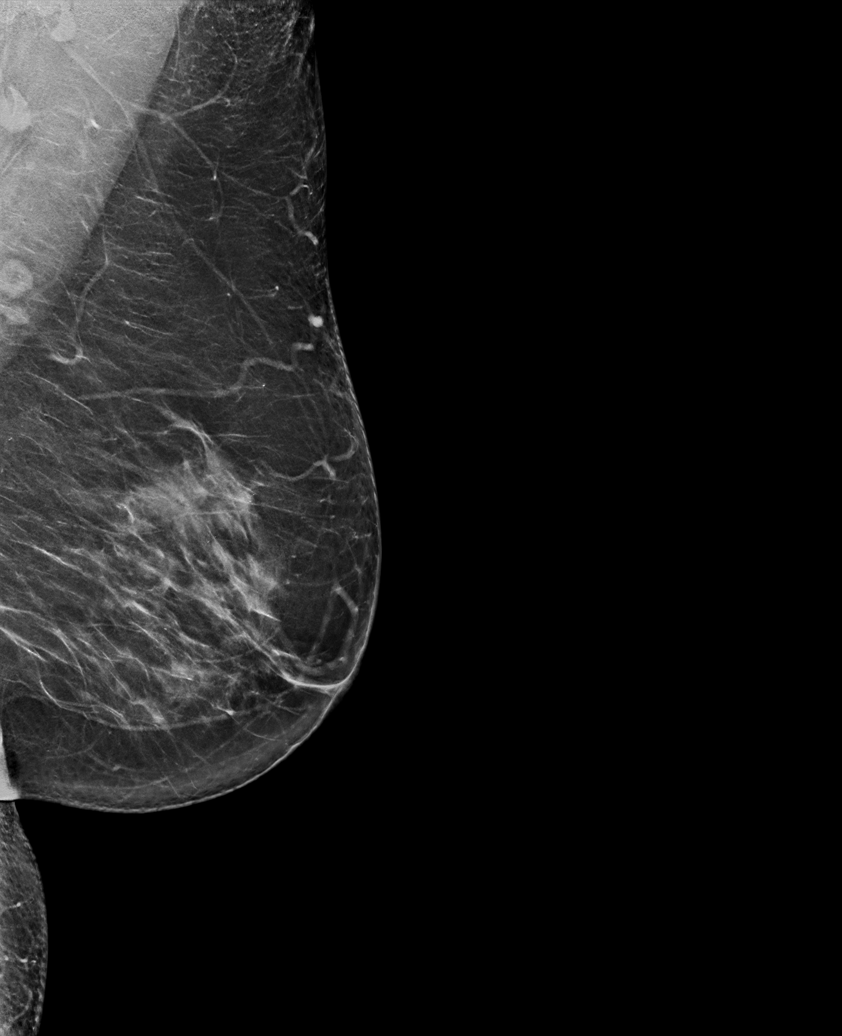

[R MLO synth-2D (2 of 2)]
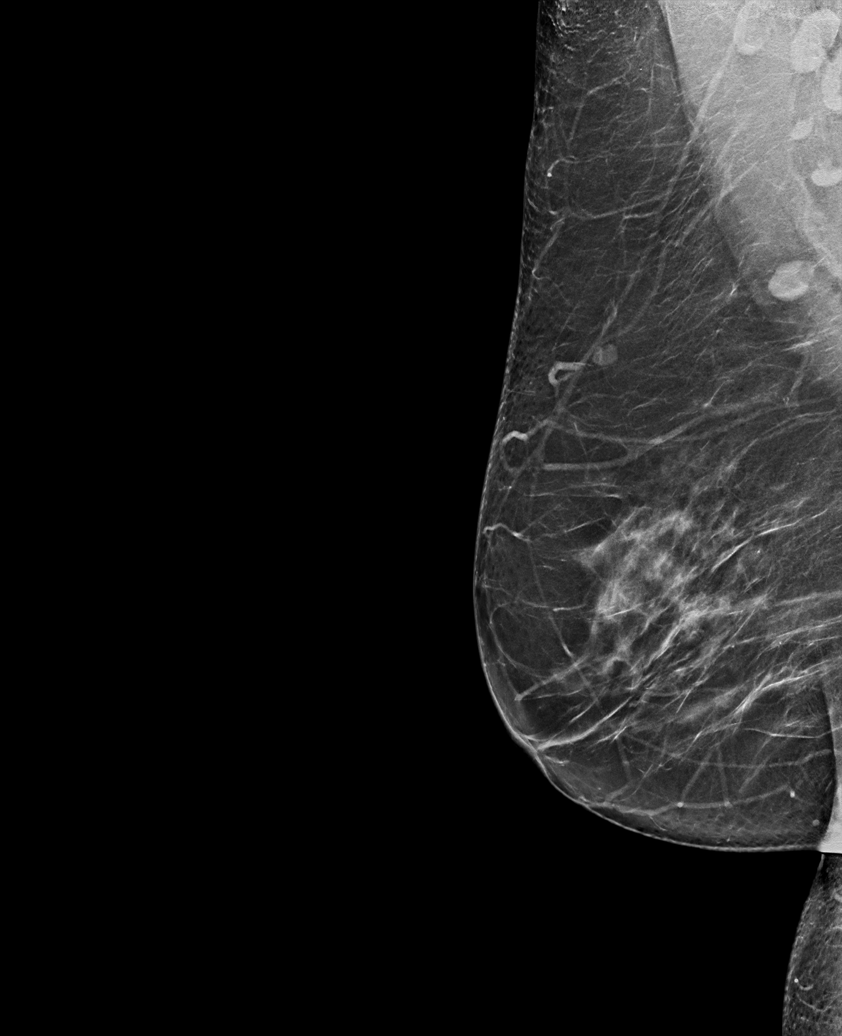

[L MLO tomo · tomo slice 38/75.0]
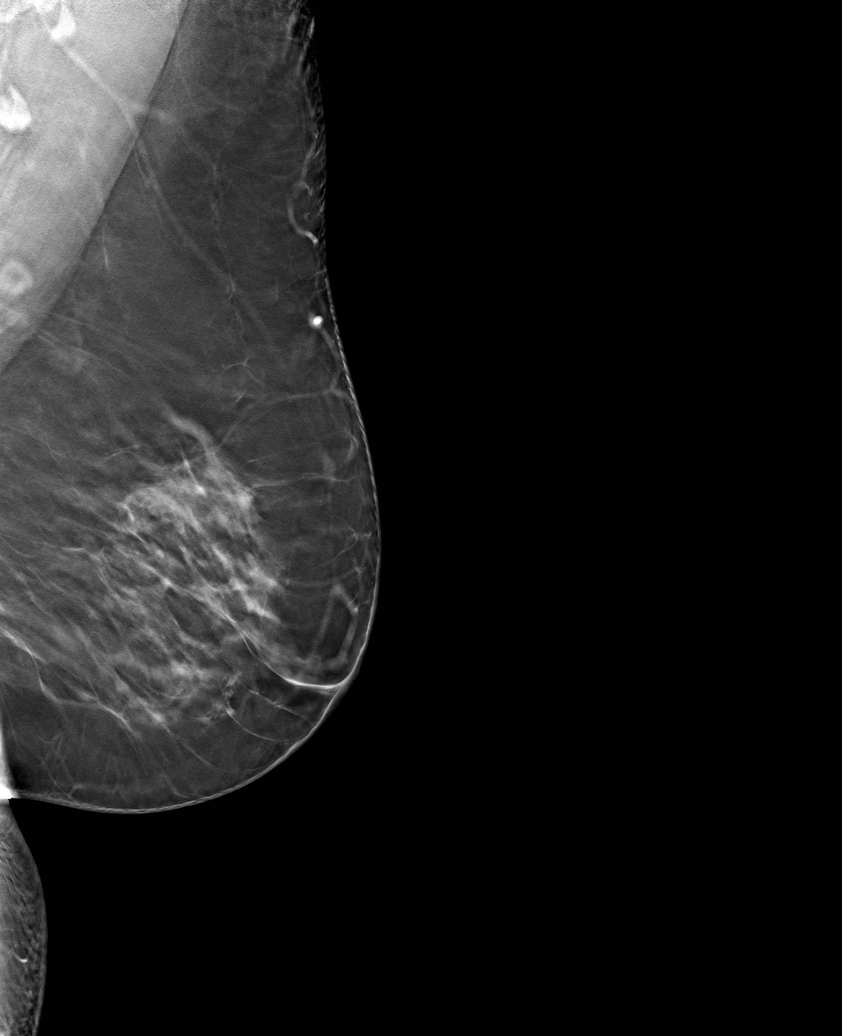

[6 of 30 positions shown; findings below may reference images not displayed]

ACR Breast Density Category b: There are scattered areas of
fibroglandular density.
FINDINGS: There are no findings suspicious for malignancy.
IMPRESSION: No mammographic evidence of malignancy. A result letter of this
screening mammogram will be mailed directly to the patient.

RECOMMENDATION:
Screening mammogram in one year. (Code:51-O-LD2)

BI-RADS CATEGORY  1: Negative.

## 2022-04-23 ENCOUNTER — Telehealth: Payer: Self-pay | Admitting: Nurse Practitioner

## 2022-04-23 NOTE — Telephone Encounter (Signed)
Copied from CRM [id number]. Topic: General - Other >> Apr 23, 2022 12:26 PM Macon Large wrote: Reason for CRM: Pt requests that Tammy return her call at [phone]

## 2022-04-27 NOTE — Progress Notes (Unsigned)
There were no vitals taken for this visit.   Subjective:    Patient ID: Mariah Mckay, female    DOB: 1965-08-01, 56 y.o.   MRN: [id number]  HPI: Mariah Mckay is a 56 y.o. female presenting on 04/28/2022 for comprehensive medical examination. Current medical complaints include:{Blank single:19197::"none","***"}  She currently lives with: Menopausal Symptoms: {Blank single:19197::"yes","no"}  HYPERLIPIDEMIA Hyperlipidemia status: {Blank single:19197::"excellent compliance","good compliance","fair compliance","poor compliance"} Satisfied with current treatment?  {Blank single:19197::"yes","no"} Side effects:  {Blank single:19197::"yes","no"} Medication compliance: {Blank single:19197::"excellent compliance","good compliance","fair compliance","poor compliance"} Past cholesterol meds: {Blank multiple:19196::"none","atorvastain (lipitor)","lovastatin (mevacor)","pravastatin (pravachol)","rosuvastatin (crestor)","simvastatin (zocor)","vytorin","fenofibrate (tricor)","gemfibrozil","ezetimide (zetia)","niaspan","lovaza"} Supplements: {Blank multiple:19196::"none","fish oil","niacin","red yeast rice"} Aspirin:  {Blank single:19197::"yes","no"} The 10-year ASCVD risk score (Arnett DK, et al., 2019) is: 1.8%   Values used to calculate the score:     Age: 85 years     Sex: Female     Is Non-Hispanic African American: No     Diabetic: No     Tobacco smoker: No     Systolic Blood Pressure: 110 mmHg     Is BP treated: No     HDL Cholesterol: 45 mg/dL     Total Cholesterol: 180 mg/dL Chest pain:  {Blank JOINOM:76720::"NOB","SJ"} Coronary artery disease:  {Blank single:19197::"yes","no"} Family history CAD:  {Blank single:19197::"yes","no"} Family history early CAD:  {Blank single:19197::"yes","no"}   Depression Screen done today and results listed below:     10/22/2021    2:43 PM 04/22/2021    2:30 PM 10/21/2020    2:36 PM 09/11/2020    3:20 PM  Depression screen PHQ 2/9  Decreased  Interest 0 0 0 0  Down, Depressed, Hopeless 0 0 0 0  PHQ - 2 Score 0 0 0 0  Altered sleeping 0 0 0 0  Tired, decreased energy 0 1 0 0  Change in appetite 0 0 0 0  Feeling bad or failure about yourself  0 0 0 0  Trouble concentrating 0 0 0 0  Moving slowly or fidgety/restless 0 0 0 0  Suicidal thoughts 0 0 0 0  PHQ-9 Score 0 1 0 0  Difficult doing work/chores Not difficult at all Not difficult at all  Not difficult at all    The patient {has/does not GGEZ:66294} a history of falls. I {did/did not:19850} complete a risk assessment for falls. A plan of care for falls {was/was not:19852} documented.   Past Medical History:  Past Medical History:  Diagnosis Date   Cervical dysplasia    Complication of anesthesia    GERD (gastroesophageal reflux disease)    Hypercholesteremia    Pneumonia    Pre-diabetes     Surgical History:  Past Surgical History:  Procedure Laterality Date   ACHILLES TENDON SURGERY     KNEE ARTHROSCOPY WITH ANTERIOR CRUCIATE LIGAMENT (ACL) REPAIR WITH HAMSTRING GRAFT Right 05/06/2020   Procedure: Right knee arthroscopy, ACL reconstruction using tibialis anterior tendon allograft, and chondroplasty - Horris Latino to Assist;  Surgeon: Signa Kell, MD;  Location: [facility] ORS;  Service: Orthopedics;  Laterality: Right;  Horris Latino to Assist   LEEP      Medications:  Current Outpatient Medications on File Prior to Visit  Medication Sig   hydrocortisone cream 0.5 % Apply 1 application  topically 2 (two) times daily.   rosuvastatin (CRESTOR) 5 MG tablet Take 1 tablet (5 mg total) by mouth daily.   No current facility-administered medications on file prior to visit.    Allergies:  Allergies  Allergen Reactions   Shellfish Allergy  Nausea And Vomiting    Social History:  Social History   Socioeconomic History   Marital status: Single    Spouse name: Not on file   Number of children: Not on file   Years of education: Not on file   Highest education  level: Not on file  Occupational History   Not on file  Tobacco Use   Smoking status: Never   Smokeless tobacco: Never  Vaping Use   Vaping Use: Never used  Substance and Sexual Activity   Alcohol use: No   Drug use: No   Sexual activity: Yes  Other Topics Concern   Not on file  Social History Narrative   Not on file   Social Determinants of Health   Financial Resource Strain: Not on file  Food Insecurity: Not on file  Transportation Needs: Not on file  Physical Activity: Not on file  Stress: Not on file  Social Connections: Not on file  Intimate Partner Violence: Not on file   Social History   Tobacco Use  Smoking Status Never  Smokeless Tobacco Never   Social History   Substance and Sexual Activity  Alcohol Use No    Family History:  Family History  Problem Relation Age of Onset   Diabetes Father    Stroke Father    Heart disease Father    Cancer Sister 97       breast   Breast cancer Sister    Diabetes Brother    Breast cancer Sister 77       77 - second time   Hypertension Mother    Macular degeneration Mother    Pulmonary fibrosis Mother     Past medical history, surgical history, medications, allergies, family history and social history reviewed with patient today and changes made to appropriate areas of the chart.   ROS All other ROS negative except what is listed above and in the HPI.      Objective:    There were no vitals taken for this visit.  Wt Readings from Last 3 Encounters:  10/22/21 192 lb 6.4 oz (87.3 kg)  04/22/21 186 lb 12.8 oz (84.7 kg)  10/21/20 184 lb 6.4 oz (83.6 kg)    Physical Exam  Results for orders placed or performed in visit on 10/22/21  Microscopic Examination   BLD  Result Value Ref Range   WBC, UA 0-5 0 - 5 /hpf   RBC, Urine 0-2 0 - 2 /hpf   Epithelial Cells (non renal) None seen 0 - 10 /hpf   Mucus, UA Present (A) Not Estab.   Bacteria, UA None seen None seen/Few  Bayer DCA Hb A1c Waived  Result Value  Ref Range   HB A1C (BAYER DCA - WAIVED) 6.2 (H) 4.8 - 5.6 %  Urinalysis, Routine w reflex microscopic  Result Value Ref Range   Specific Gravity, UA >1.030 (H) 1.005 - 1.030   pH, UA 6.0 5.0 - 7.5   Color, UA Yellow Yellow   Appearance Ur Cloudy (A) Clear   Leukocytes,UA 1+ (A) Negative   Protein,UA Negative Negative/Trace   Glucose, UA Negative Negative   Ketones, UA Negative Negative   RBC, UA Trace (A) Negative   Bilirubin, UA Negative Negative   Urobilinogen, Ur 0.2 0.2 - 1.0 mg/dL   Nitrite, UA Negative Negative   Microscopic Examination See below:       Assessment & Plan:   Problem List Items Addressed This Visit  Other   Elevated cholesterol   Vitamin D deficiency - Primary   Prediabetes     Follow up plan: No follow-ups on file.   LABORATORY TESTING:  - Pap smear: {Blank single:19197::"pap done","not applicable","up to date","done elsewhere"}  IMMUNIZATIONS:   - Tdap: Tetanus vaccination status reviewed: {tetanus status:315746}. - Influenza: {Blank single:19197::"Up to date","Administered today","Postponed to flu season","Refused","Given elsewhere"} - Pneumovax: {Blank single:19197::"Up to date","Administered today","Not applicable","Refused","Given elsewhere"} - Prevnar: {Blank single:19197::"Up to date","Administered today","Not applicable","Refused","Given elsewhere"} - COVID: {Blank single:19197::"Up to date","Administered today","Not applicable","Refused","Given elsewhere"} - HPV: {Blank single:19197::"Up to date","Administered today","Not applicable","Refused","Given elsewhere"} - Shingrix vaccine: {Blank single:19197::"Up to date","Administered today","Not applicable","Refused","Given elsewhere"}  SCREENING: -Mammogram: {Blank single:19197::"Up to date","Ordered today","Not applicable","Refused","Done elsewhere"}  - Colonoscopy: {Blank single:19197::"Up to date","Ordered today","Not applicable","Refused","Done elsewhere"}  - Bone Density: {Blank  single:19197::"Up to date","Ordered today","Not applicable","Refused","Done elsewhere"}  -Hearing Test: {Blank single:19197::"Up to date","Ordered today","Not applicable","Refused","Done elsewhere"}  -Spirometry: {Blank single:19197::"Up to date","Ordered today","Not applicable","Refused","Done elsewhere"}   PATIENT COUNSELING:   Advised to take 1 mg of folate supplement per day if capable of pregnancy.   Sexuality: Discussed sexually transmitted diseases, partner selection, use of condoms, avoidance of unintended pregnancy  and contraceptive alternatives.   Advised to avoid cigarette smoking.  I discussed with the patient that most people either abstain from alcohol or drink within safe limits (<=14/week and <=4 drinks/occasion for males, <=7/weeks and <= 3 drinks/occasion for females) and that the risk for alcohol disorders and other health effects rises proportionally with the number of drinks per week and how often a drinker exceeds daily limits.  Discussed cessation/primary prevention of drug use and availability of treatment for abuse.   Diet: Encouraged to adjust caloric intake to maintain  or achieve ideal body weight, to reduce intake of dietary saturated fat and total fat, to limit sodium intake by avoiding high sodium foods and not adding table salt, and to maintain adequate dietary potassium and calcium preferably from fresh fruits, vegetables, and low-fat dairy products.    stressed the importance of regular exercise  Injury prevention: Discussed safety belts, safety helmets, smoke detector, smoking near bedding or upholstery.   Dental health: Discussed importance of regular tooth brushing, flossing, and dental visits.    NEXT PREVENTATIVE PHYSICAL DUE IN 1 YEAR. No follow-ups on file.

## 2022-04-28 ENCOUNTER — Encounter: Payer: Self-pay | Admitting: Nurse Practitioner

## 2022-04-28 ENCOUNTER — Ambulatory Visit: Payer: Managed Care, Other (non HMO) | Admitting: Nurse Practitioner

## 2022-04-28 ENCOUNTER — Other Ambulatory Visit (HOSPITAL_COMMUNITY)
Admission: RE | Admit: 2022-04-28 | Discharge: 2022-04-28 | Disposition: A | Discharge status: Home / Self Care | Payer: Managed Care, Other (non HMO) | Source: Ambulatory Visit | Attending: Unknown Physician Specialty | Admitting: Unknown Physician Specialty

## 2022-04-28 VITALS — BP 119/76 | HR 86 | Temp 98.5°F | Ht <= 58 in | Wt 194.8 lb

## 2022-04-28 DIAGNOSIS — E559 Vitamin D deficiency, unspecified: Secondary | ICD-10-CM | POA: Diagnosis not present

## 2022-04-28 DIAGNOSIS — E7800 Pure hypercholesterolemia, unspecified: Secondary | ICD-10-CM | POA: Diagnosis not present

## 2022-04-28 DIAGNOSIS — Z0000 Encounter for general adult medical examination without abnormal findings: Secondary | ICD-10-CM | POA: Insufficient documentation

## 2022-04-28 DIAGNOSIS — R7303 Prediabetes: Secondary | ICD-10-CM | POA: Diagnosis not present

## 2022-04-28 DIAGNOSIS — Z23 Encounter for immunization: Secondary | ICD-10-CM

## 2022-04-28 DIAGNOSIS — M79672 Pain in left foot: Secondary | ICD-10-CM

## 2022-04-28 DIAGNOSIS — K602 Anal fissure, unspecified: Secondary | ICD-10-CM

## 2022-04-28 LAB — URINALYSIS, ROUTINE W REFLEX MICROSCOPIC
Bilirubin, UA: NEGATIVE
Glucose, UA: NEGATIVE
Leukocytes,UA: NEGATIVE
Nitrite, UA: NEGATIVE
Protein,UA: NEGATIVE
RBC, UA: NEGATIVE
Specific Gravity, UA: 1.02 (ref 1.005–1.030)
Urobilinogen, Ur: 0.2 mg/dL (ref 0.2–1.0)
pH, UA: 6.5 (ref 5.0–7.5)

## 2022-04-28 MED ORDER — ROSUVASTATIN CALCIUM 5 MG PO TABS: 5.0000 mg | ORAL_TABLET | Freq: Every day | ORAL | 1 refills | Status: DC

## 2022-04-28 NOTE — Assessment & Plan Note (Addendum)
Chronic.  Controlled.  Continue with current medication regimen of Crestor 5mg.  Refill sent today.  Labs ordered today.  Return to clinic in 6 months for reevaluation.  Call sooner if concerns arise.   

## 2022-04-28 NOTE — Assessment & Plan Note (Signed)
Recommended eating smaller high protein, low fat meals more frequently and exercising 30 mins a day 5 times a week with a goal of 10-15lb weight loss in the next 3 months. Patient voiced their understanding and motivation to adhere to these recommendations.  

## 2022-04-28 NOTE — Assessment & Plan Note (Signed)
Labs ordered at visit today.  Will make recommendations based on lab results.   

## 2022-04-29 LAB — LIPID PANEL
Chol/HDL Ratio: 3.5 ratio (ref 0.0–4.4)
Cholesterol, Total: 166 mg/dL (ref 100–199)
HDL: 47 mg/dL (ref >39)
LDL Chol Calc (NIH): 92 mg/dL (ref 0–99)
Triglycerides: 152 mg/dL — ABNORMAL HIGH (ref 0–149)
VLDL Cholesterol Cal: 27 mg/dL (ref 5–40)

## 2022-04-29 LAB — CBC WITH DIFFERENTIAL/PLATELET
Basophils Absolute: 0 10*3/uL (ref 0.0–0.2)
Basos: 0 %
EOS (ABSOLUTE): 0.1 10*3/uL (ref 0.0–0.4)
Eos: 1 %
Hematocrit: 42.7 % (ref 34.0–46.6)
Hemoglobin: 14.3 g/dL (ref 11.1–15.9)
Immature Grans (Abs): 0 10*3/uL (ref 0.0–0.1)
Immature Granulocytes: 0 %
Lymphocytes Absolute: 3.1 10*3/uL (ref 0.7–3.1)
Lymphs: 37 %
MCH: 28.4 pg (ref 26.6–33.0)
MCHC: 33.5 g/dL (ref 31.5–35.7)
MCV: 85 fL (ref 79–97)
Monocytes Absolute: 0.6 10*3/uL (ref 0.1–0.9)
Monocytes: 7 %
Neutrophils Absolute: 4.5 10*3/uL (ref 1.4–7.0)
Neutrophils: 55 %
Platelets: 321 10*3/uL (ref 150–450)
RBC: 5.03 x10E6/uL (ref 3.77–5.28)
RDW: 12.8 % (ref 11.7–15.4)
WBC: 8.3 10*3/uL (ref 3.4–10.8)

## 2022-04-29 LAB — HEMOGLOBIN A1C
Est. average glucose Bld gHb Est-mCnc: 140 mg/dL
Hgb A1c MFr Bld: 6.5 % — ABNORMAL HIGH (ref 4.8–5.6)

## 2022-04-29 LAB — COMPREHENSIVE METABOLIC PANEL
ALT: 26 IU/L (ref 0–32)
AST: 25 IU/L (ref 0–40)
Albumin/Globulin Ratio: 2 (ref 1.2–2.2)
Albumin: 4.8 g/dL (ref 3.8–4.9)
Alkaline Phosphatase: 72 IU/L (ref 44–121)
BUN/Creatinine Ratio: 22 (ref 9–23)
BUN: 19 mg/dL (ref 6–24)
Bilirubin Total: 0.8 mg/dL (ref 0.0–1.2)
CO2: 25 mmol/L (ref 20–29)
Calcium: 9.3 mg/dL (ref 8.7–10.2)
Chloride: 101 mmol/L (ref 96–106)
Creatinine, Ser: 0.88 mg/dL (ref 0.57–1.00)
Globulin, Total: 2.4 g/dL (ref 1.5–4.5)
Glucose: 96 mg/dL (ref 70–99)
Potassium: 4.2 mmol/L (ref 3.5–5.2)
Sodium: 141 mmol/L (ref 134–144)
Total Protein: 7.2 g/dL (ref 6.0–8.5)
eGFR: 77 mL/min/{1.73_m2} (ref >59)

## 2022-04-29 LAB — TSH: TSH: 0.839 u[IU]/mL (ref 0.450–4.500)

## 2022-04-29 LAB — VITAMIN D 25 HYDROXY (VIT D DEFICIENCY, FRACTURES): Vit D, 25-Hydroxy: 55.4 ng/mL (ref 30.0–100.0)

## 2022-04-29 NOTE — Progress Notes (Signed)
Please let patient know that her lab work came back showing that she is now diabetic.  I recommend she come back in so we can discuss the management of this.  It can be some time in January.  Otherwise, her lab work looks good.  Vitamin D is within normal range.  No other concerns at this time.

## 2022-05-05 LAB — CYTOLOGY - PAP: Diagnosis: NEGATIVE

## 2022-05-19 ENCOUNTER — Encounter: Payer: Self-pay | Admitting: Nurse Practitioner

## 2022-05-19 ENCOUNTER — Ambulatory Visit: Payer: Managed Care, Other (non HMO) | Admitting: Nurse Practitioner

## 2022-05-19 VITALS — BP 126/74 | HR 75 | Temp 98.0°F | Wt 195.8 lb

## 2022-05-19 DIAGNOSIS — E1169 Type 2 diabetes mellitus with other specified complication: Secondary | ICD-10-CM | POA: Diagnosis not present

## 2022-05-19 MED ORDER — LISINOPRIL 2.5 MG PO TABS: 2.5000 mg | ORAL_TABLET | Freq: Every day | ORAL | 1 refills | Status: DC

## 2022-05-19 MED ORDER — OZEMPIC (0.25 OR 0.5 MG/DOSE) 2 MG/1.5ML ~~LOC~~ SOPN: 0.2500 mg | PEN_INJECTOR | SUBCUTANEOUS | 1 refills | Status: DC

## 2022-05-19 MED ORDER — HYDROCORTISONE (PERIANAL) 2.5 % EX CREA: 1.0000 | TOPICAL_CREAM | Freq: Two times a day (BID) | CUTANEOUS | 1 refills | Status: AC

## 2022-05-19 NOTE — Assessment & Plan Note (Signed)
Chronic. Well controlled.  Will start Lisinopril 2.5mg  for kidney protection.  Recommend continuing with Crestor 5mg  daily for cardiac protection.  Will start Ozempic 0.25mg .  Side effects and benefits of medication discussed during visit.  Discussed titrating medication and how to inject.  Showed patient how to inject medication during visit.  Follow up in 1 month.  Call sooner if concerns arise.

## 2022-05-19 NOTE — Progress Notes (Signed)
BP 126/74   Pulse 75   Temp 98 F (36.7 C) (Oral)   Wt 195 lb 12.8 oz (88.8 kg)   SpO2 96%   BMI 40.92 kg/m    Subjective:    Patient ID: Mariah Mckay, female    DOB: Aug 24, 1965, 57 y.o.   MRN: [id number]  HPI: Mariah Mckay is a 57 y.o. female  Chief Complaint  Patient presents with   Diabetes   DIABETES Newly diagnosed.  Discussed medication regimens and options during visit.  Denies HA, CP, SOB, dizziness, palpitations, visual changes, and lower extremity swelling.   Relevant past medical, surgical, family and social history reviewed and updated as indicated. Interim medical history since our last visit reviewed. Allergies and medications reviewed and updated.  Review of Systems  Eyes:  Negative for visual disturbance.  Respiratory:  Negative for cough, chest tightness and shortness of breath.   Cardiovascular:  Negative for chest pain, palpitations and leg swelling.  Neurological:  Negative for dizziness and headaches.    Per HPI unless specifically indicated above     Objective:    BP 126/74   Pulse 75   Temp 98 F (36.7 C) (Oral)   Wt 195 lb 12.8 oz (88.8 kg)   SpO2 96%   BMI 40.92 kg/m   Wt Readings from Last 3 Encounters:  05/19/22 195 lb 12.8 oz (88.8 kg)  04/28/22 194 lb 12.8 oz (88.4 kg)  10/22/21 192 lb 6.4 oz (87.3 kg)    Physical Exam Vitals and nursing note reviewed.  Constitutional:      General: She is not in acute distress.    Appearance: Normal appearance. She is normal weight. She is not ill-appearing, toxic-appearing or diaphoretic.  HENT:     Head: Normocephalic.     Right Ear: External ear normal.     Left Ear: External ear normal.     Nose: Nose normal.     Mouth/Throat:     Mouth: Mucous membranes are moist.     Pharynx: Oropharynx is clear.  Eyes:     General:        Right eye: No discharge.        Left eye: No discharge.     Extraocular Movements: Extraocular movements intact.     Conjunctiva/sclera:  Conjunctivae normal.     Pupils: Pupils are equal, round, and reactive to light.  Cardiovascular:     Rate and Rhythm: Normal rate and regular rhythm.     Heart sounds: No murmur heard. Pulmonary:     Effort: Pulmonary effort is normal. No respiratory distress.     Breath sounds: Normal breath sounds. No wheezing or rales.  Musculoskeletal:     Cervical back: Normal range of motion and neck supple.  Skin:    General: Skin is warm and dry.     Capillary Refill: Capillary refill takes less than 2 seconds.  Neurological:     General: No focal deficit present.     Mental Status: She is alert and oriented to person, place, and time. Mental status is at baseline.  Psychiatric:        Mood and Affect: Mood normal.        Behavior: Behavior normal.        Thought Content: Thought content normal.        Judgment: Judgment normal.     Results for orders placed or performed in visit on 04/28/22  HgB A1c  Result Value Ref Range  Hgb A1c MFr Bld 6.5 (H) 4.8 - 5.6 %   Est. average glucose Bld gHb Est-mCnc 140 mg/dL  CBC with Differential/Platelet  Result Value Ref Range   WBC 8.3 3.4 - 10.8 x10E3/uL   RBC 5.03 3.77 - 5.28 x10E6/uL   Hemoglobin 14.3 11.1 - 15.9 g/dL   Hematocrit 34.1 93.7 - 46.6 %   MCV 85 79 - 97 fL   MCH 28.4 26.6 - 33.0 pg   MCHC 33.5 31.5 - 35.7 g/dL   RDW 90.2 40.9 - 73.5 %   Platelets 321 150 - 450 x10E3/uL   Neutrophils 55 Not Estab. %   Lymphs 37 Not Estab. %   Monocytes 7 Not Estab. %   Eos 1 Not Estab. %   Basos 0 Not Estab. %   Neutrophils Absolute 4.5 1.4 - 7.0 x10E3/uL   Lymphocytes Absolute 3.1 0.7 - 3.1 x10E3/uL   Monocytes Absolute 0.6 0.1 - 0.9 x10E3/uL   EOS (ABSOLUTE) 0.1 0.0 - 0.4 x10E3/uL   Basophils Absolute 0.0 0.0 - 0.2 x10E3/uL   Immature Granulocytes 0 Not Estab. %   Immature Grans (Abs) 0.0 0.0 - 0.1 x10E3/uL  Comprehensive metabolic panel  Result Value Ref Range   Glucose 96 70 - 99 mg/dL   BUN 19 6 - 24 mg/dL   Creatinine, Ser  3.29 0.57 - 1.00 mg/dL   eGFR 77 >92 EQ/AST/4.19   BUN/Creatinine Ratio 22 9 - 23   Sodium 141 134 - 144 mmol/L   Potassium 4.2 3.5 - 5.2 mmol/L   Chloride 101 96 - 106 mmol/L   CO2 25 20 - 29 mmol/L   Calcium 9.3 8.7 - 10.2 mg/dL   Total Protein 7.2 6.0 - 8.5 g/dL   Albumin 4.8 3.8 - 4.9 g/dL   Globulin, Total 2.4 1.5 - 4.5 g/dL   Albumin/Globulin Ratio 2.0 1.2 - 2.2   Bilirubin Total 0.8 0.0 - 1.2 mg/dL   Alkaline Phosphatase 72 44 - 121 IU/L   AST 25 0 - 40 IU/L   ALT 26 0 - 32 IU/L  Lipid panel  Result Value Ref Range   Cholesterol, Total 166 100 - 199 mg/dL   Triglycerides 622 (H) 0 - 149 mg/dL   HDL 47 >29 mg/dL   VLDL Cholesterol Cal 27 5 - 40 mg/dL   LDL Chol Calc (NIH) 92 0 - 99 mg/dL   Chol/HDL Ratio 3.5 0.0 - 4.4 ratio  TSH  Result Value Ref Range   TSH 0.839 0.450 - 4.500 uIU/mL  Urinalysis, Routine w reflex microscopic  Result Value Ref Range   Specific Gravity, UA 1.020 1.005 - 1.030   pH, UA 6.5 5.0 - 7.5   Color, UA Yellow Yellow   Appearance Ur Clear Clear   Leukocytes,UA Negative Negative   Protein,UA Negative Negative/Trace   Glucose, UA Negative Negative   Ketones, UA Trace (A) Negative   RBC, UA Negative Negative   Bilirubin, UA Negative Negative   Urobilinogen, Ur 0.2 0.2 - 1.0 mg/dL   Nitrite, UA Negative Negative   Microscopic Examination Comment   Vitamin D (25 hydroxy)  Result Value Ref Range   Vit D, 25-Hydroxy 55.4 30.0 - 100.0 ng/mL  Cytology - PAP  Result Value Ref Range   Adequacy      Satisfactory for evaluation; transformation zone component PRESENT.   Diagnosis      - Negative for intraepithelial lesion or malignancy (NILM)      Assessment & Plan:   Problem  List Items Addressed This Visit       Endocrine   Type 2 diabetes mellitus with other specified complication ([place]) - Primary    Chronic. Well controlled.  Will start Lisinopril 2.5mg  for kidney protection.  Recommend continuing with Crestor 5mg  daily for cardiac  protection.  Will start Ozempic 0.25mg .  Side effects and benefits of medication discussed during visit.  Discussed titrating medication and how to inject.  Showed patient how to inject medication during visit.  Follow up in 1 month.  Call sooner if concerns arise.       Relevant Medications   lisinopril (ZESTRIL) 2.5 MG tablet   Semaglutide,0.25 or 0.5MG /DOS, (OZEMPIC, 0.25 OR 0.5 MG/DOSE,) 2 MG/1.5ML SOPN     Follow up plan: Return in about 1 month (around 06/19/2022) for HTN, HLD, DM2 FU.

## 2022-05-21 ENCOUNTER — Telehealth: Payer: Self-pay

## 2022-05-21 NOTE — Telephone Encounter (Signed)
PA initiated and submitted for Ozempic via Cover My Meds.

## 2022-06-04 ENCOUNTER — Encounter: Payer: Self-pay | Admitting: Nurse Practitioner

## 2022-06-22 ENCOUNTER — Encounter: Payer: Self-pay | Admitting: Nurse Practitioner

## 2022-06-22 ENCOUNTER — Ambulatory Visit: Payer: Managed Care, Other (non HMO) | Admitting: Nurse Practitioner

## 2022-06-22 VITALS — BP 109/73 | HR 82 | Temp 98.5°F | Wt 197.3 lb

## 2022-06-22 DIAGNOSIS — E1169 Type 2 diabetes mellitus with other specified complication: Secondary | ICD-10-CM | POA: Diagnosis not present

## 2022-06-22 MED ORDER — METFORMIN HCL 500 MG PO TABS: 500.0000 mg | ORAL_TABLET | Freq: Two times a day (BID) | ORAL | 1 refills | Status: DC

## 2022-06-22 NOTE — Assessment & Plan Note (Signed)
Chronic.  Last A1c 6.5%.  Ozempic was not approved by insurance.  Will start Metformin 593m BID.  Side effects and benefits of medication discussed during visit today.  Follow up in 6 weeks.  Call sooner if concerns arise.

## 2022-06-22 NOTE — Progress Notes (Signed)
BP 109/73   Pulse 82   Temp 98.5 F (36.9 C) (Oral)   Wt 197 lb 4.8 oz (89.5 kg)   SpO2 96%   BMI 41.24 kg/m    Subjective:    Patient ID: Mariah Mckay, female    DOB: 01-02-1966, 57 y.o.   MRN: [id number]  HPI: Mariah Mckay is a 57 y.o. female  Chief Complaint  Patient presents with   Diabetes    No recent eye exam    Hyperlipidemia   Hypertension   DIABETES Hypoglycemic episodes:no Polydipsia/polyuria: no Visual disturbance: no Chest pain: no Paresthesias: no Glucose Monitoring: no  Accucheck frequency: Not Checking  Fasting glucose:  Post prandial:  Evening:  Before meals: Taking Insulin?: no  Long acting insulin:  Short acting insulin: Ozempic was not approved by her insurance.       Relevant past medical, surgical, family and social history reviewed and updated as indicated. Interim medical history since our last visit reviewed. Allergies and medications reviewed and updated.  Review of Systems  Eyes:  Negative for visual disturbance.  Cardiovascular:  Negative for chest pain.  Endocrine: Negative for polydipsia and polyuria.  Neurological:  Negative for numbness.    Per HPI unless specifically indicated above     Objective:    BP 109/73   Pulse 82   Temp 98.5 F (36.9 C) (Oral)   Wt 197 lb 4.8 oz (89.5 kg)   SpO2 96%   BMI 41.24 kg/m   Wt Readings from Last 3 Encounters:  06/22/22 197 lb 4.8 oz (89.5 kg)  05/19/22 195 lb 12.8 oz (88.8 kg)  04/28/22 194 lb 12.8 oz (88.4 kg)    Physical Exam Vitals and nursing note reviewed.  Constitutional:      General: She is not in acute distress.    Appearance: Normal appearance. She is normal weight. She is not ill-appearing, toxic-appearing or diaphoretic.  HENT:     Head: Normocephalic.     Right Ear: External ear normal.     Left Ear: External ear normal.     Nose: Nose normal.     Mouth/Throat:     Mouth: Mucous membranes are moist.     Pharynx: Oropharynx is clear.  Eyes:      General:        Right eye: No discharge.        Left eye: No discharge.     Extraocular Movements: Extraocular movements intact.     Conjunctiva/sclera: Conjunctivae normal.     Pupils: Pupils are equal, round, and reactive to light.  Cardiovascular:     Rate and Rhythm: Normal rate and regular rhythm.     Heart sounds: No murmur heard. Pulmonary:     Effort: Pulmonary effort is normal. No respiratory distress.     Breath sounds: Normal breath sounds. No wheezing or rales.  Musculoskeletal:     Cervical back: Normal range of motion and neck supple.  Skin:    General: Skin is warm and dry.     Capillary Refill: Capillary refill takes less than 2 seconds.  Neurological:     General: No focal deficit present.     Mental Status: She is alert and oriented to person, place, and time. Mental status is at baseline.  Psychiatric:        Mood and Affect: Mood normal.        Behavior: Behavior normal.        Thought Content: Thought content normal.  Judgment: Judgment normal.     Results for orders placed or performed in visit on 04/28/22  HgB A1c  Result Value Ref Range   Hgb A1c MFr Bld 6.5 (H) 4.8 - 5.6 %   Est. average glucose Bld gHb Est-mCnc 140 mg/dL  CBC with Differential/Platelet  Result Value Ref Range   WBC 8.3 3.4 - 10.8 x10E3/uL   RBC 5.03 3.77 - 5.28 x10E6/uL   Hemoglobin 14.3 11.1 - 15.9 g/dL   Hematocrit 42.7 34.0 - 46.6 %   MCV 85 79 - 97 fL   MCH 28.4 26.6 - 33.0 pg   MCHC 33.5 31.5 - 35.7 g/dL   RDW 12.8 11.7 - 15.4 %   Platelets 321 150 - 450 x10E3/uL   Neutrophils 55 Not Estab. %   Lymphs 37 Not Estab. %   Monocytes 7 Not Estab. %   Eos 1 Not Estab. %   Basos 0 Not Estab. %   Neutrophils Absolute 4.5 1.4 - 7.0 x10E3/uL   Lymphocytes Absolute 3.1 0.7 - 3.1 x10E3/uL   Monocytes Absolute 0.6 0.1 - 0.9 x10E3/uL   EOS (ABSOLUTE) 0.1 0.0 - 0.4 x10E3/uL   Basophils Absolute 0.0 0.0 - 0.2 x10E3/uL   Immature Granulocytes 0 Not Estab. %   Immature Grans  (Abs) 0.0 0.0 - 0.1 x10E3/uL  Comprehensive metabolic panel  Result Value Ref Range   Glucose 96 70 - 99 mg/dL   BUN 19 6 - 24 mg/dL   Creatinine, Ser 0.88 0.57 - 1.00 mg/dL   eGFR 77 >59 mL/min/1.73   BUN/Creatinine Ratio 22 9 - 23   Sodium 141 134 - 144 mmol/L   Potassium 4.2 3.5 - 5.2 mmol/L   Chloride 101 96 - 106 mmol/L   CO2 25 20 - 29 mmol/L   Calcium 9.3 8.7 - 10.2 mg/dL   Total Protein 7.2 6.0 - 8.5 g/dL   Albumin 4.8 3.8 - 4.9 g/dL   Globulin, Total 2.4 1.5 - 4.5 g/dL   Albumin/Globulin Ratio 2.0 1.2 - 2.2   Bilirubin Total 0.8 0.0 - 1.2 mg/dL   Alkaline Phosphatase 72 44 - 121 IU/L   AST 25 0 - 40 IU/L   ALT 26 0 - 32 IU/L  Lipid panel  Result Value Ref Range   Cholesterol, Total 166 100 - 199 mg/dL   Triglycerides 152 (H) 0 - 149 mg/dL   HDL 47 >39 mg/dL   VLDL Cholesterol Cal 27 5 - 40 mg/dL   LDL Chol Calc (NIH) 92 0 - 99 mg/dL   Chol/HDL Ratio 3.5 0.0 - 4.4 ratio  TSH  Result Value Ref Range   TSH 0.839 0.450 - 4.500 uIU/mL  Urinalysis, Routine w reflex microscopic  Result Value Ref Range   Specific Gravity, UA 1.020 1.005 - 1.030   pH, UA 6.5 5.0 - 7.5   Color, UA Yellow Yellow   Appearance Ur Clear Clear   Leukocytes,UA Negative Negative   Protein,UA Negative Negative/Trace   Glucose, UA Negative Negative   Ketones, UA Trace (A) Negative   RBC, UA Negative Negative   Bilirubin, UA Negative Negative   Urobilinogen, Ur 0.2 0.2 - 1.0 mg/dL   Nitrite, UA Negative Negative   Microscopic Examination Comment   Vitamin D (25 hydroxy)  Result Value Ref Range   Vit D, 25-Hydroxy 55.4 30.0 - 100.0 ng/mL  Cytology - PAP  Result Value Ref Range   Adequacy      Satisfactory for evaluation; transformation zone component PRESENT.  Diagnosis      - Negative for intraepithelial lesion or malignancy (NILM)      Assessment & Plan:   Problem List Items Addressed This Visit       Endocrine   Type 2 diabetes mellitus with other specified complication ([place]) -  Primary    Chronic.  Last A1c 6.5%.  Ozempic was not approved by insurance.  Will start Metformin 583m BID.  Side effects and benefits of medication discussed during visit today.  Follow up in 6 weeks.  Call sooner if concerns arise.       Relevant Medications   metFORMIN (GLUCOPHAGE) 500 MG tablet     Follow up plan: Return in about 6 weeks (around 08/03/2022) for HTN, HLD, DM2 FU.

## 2022-08-06 ENCOUNTER — Encounter: Payer: Self-pay | Admitting: Nurse Practitioner

## 2022-08-06 ENCOUNTER — Ambulatory Visit: Payer: Managed Care, Other (non HMO) | Admitting: Nurse Practitioner

## 2022-08-06 VITALS — BP 114/75 | HR 89 | Temp 98.4°F | Wt 197.5 lb

## 2022-08-06 DIAGNOSIS — Z114 Encounter for screening for human immunodeficiency virus [HIV]: Secondary | ICD-10-CM

## 2022-08-06 DIAGNOSIS — Z1159 Encounter for screening for other viral diseases: Secondary | ICD-10-CM

## 2022-08-06 DIAGNOSIS — E1169 Type 2 diabetes mellitus with other specified complication: Secondary | ICD-10-CM

## 2022-08-06 DIAGNOSIS — E7800 Pure hypercholesterolemia, unspecified: Secondary | ICD-10-CM | POA: Diagnosis not present

## 2022-08-06 DIAGNOSIS — E785 Hyperlipidemia, unspecified: Secondary | ICD-10-CM

## 2022-08-06 LAB — MICROALBUMIN, URINE WAIVED
Creatinine, Urine Waived: 50 mg/dL (ref 10–300)
Microalb, Ur Waived: 30 mg/L — ABNORMAL HIGH (ref 0–19)

## 2022-08-06 MED ORDER — ROSUVASTATIN CALCIUM 5 MG PO TABS: 5.0000 mg | ORAL_TABLET | Freq: Every day | ORAL | 1 refills | Status: DC

## 2022-08-06 MED ORDER — LISINOPRIL 2.5 MG PO TABS: 2.5000 mg | ORAL_TABLET | Freq: Every day | ORAL | 1 refills | Status: DC

## 2022-08-06 NOTE — Progress Notes (Signed)
BP 114/75   Pulse 89   Temp 98.4 F (36.9 C) (Oral)   Wt 197 lb 8 oz (89.6 kg)   SpO2 98%   BMI 41.28 kg/m    Subjective:    Patient ID: Mariah Mckay, female    DOB: May 15, 1965, 57 y.o.   MRN: [id number]  HPI: Mariah Mckay is a 57 y.o. female  Chief Complaint  Patient presents with   Diabetes    Pt states she has not had a recent eye exam    Hyperlipidemia   Hypertension   DIABETES Patient states she was not able to take the Metformin due to the diarrhea.  She took it for two weeks and the symptoms were ongoing.   Hypoglycemic episodes:no Polydipsia/polyuria: no Visual disturbance: no Chest pain: no Paresthesias: no Glucose Monitoring: no  Accucheck frequency: Not Checking  Fasting glucose:  Post prandial:  Evening:  Before meals: Taking Insulin?: no  Long acting insulin:  Short acting insulin: Blood Pressure Monitoring: not checking Retinal Examination: Not up to Date Foot Exam: Up to Date Diabetic Education: Not Completed Pneumovax:  up to date Influenza: Up to Date Aspirin: no   HYPERLIPIDEMIA Hyperlipidemia status: excellent compliance Satisfied with current treatment?  no Side effects:  yes Medication compliance: excellent compliance Past cholesterol meds: rosuvastatin (crestor) Supplements: none Aspirin:  no The 10-year ASCVD risk score (Arnett DK, et al., 2019) is: 3.2%   Values used to calculate the score:     Age: 60 years     Sex: Female     Is Non-Hispanic African American: No     Diabetic: Yes     Tobacco smoker: No     Systolic Blood Pressure: 99991111 mmHg     Is BP treated: No     HDL Cholesterol: 47 mg/dL     Total Cholesterol: 166 mg/dL Chest pain:  no Coronary artery disease:  no Family history CAD:  no Family history early CAD:  no   Denies HA, CP, SOB, dizziness, palpitations, visual changes, and lower extremity swelling.   Relevant past medical, surgical, family and social history reviewed and updated as indicated.  Interim medical history since our last visit reviewed. Allergies and medications reviewed and updated.  Review of Systems  Eyes:  Negative for visual disturbance.  Respiratory:  Negative for cough, chest tightness and shortness of breath.   Cardiovascular:  Negative for chest pain, palpitations and leg swelling.  Endocrine: Negative for polydipsia and polyuria.  Neurological:  Negative for dizziness, numbness and headaches.    Per HPI unless specifically indicated above     Objective:    BP 114/75   Pulse 89   Temp 98.4 F (36.9 C) (Oral)   Wt 197 lb 8 oz (89.6 kg)   SpO2 98%   BMI 41.28 kg/m   Wt Readings from Last 3 Encounters:  08/06/22 197 lb 8 oz (89.6 kg)  06/22/22 197 lb 4.8 oz (89.5 kg)  05/19/22 195 lb 12.8 oz (88.8 kg)    Physical Exam Vitals and nursing note reviewed.  Constitutional:      General: She is not in acute distress.    Appearance: Normal appearance. She is normal weight. She is not ill-appearing, toxic-appearing or diaphoretic.  HENT:     Head: Normocephalic.     Right Ear: External ear normal.     Left Ear: External ear normal.     Nose: Nose normal.     Mouth/Throat:     Mouth:  Mucous membranes are moist.     Pharynx: Oropharynx is clear.  Eyes:     General:        Right eye: No discharge.        Left eye: No discharge.     Extraocular Movements: Extraocular movements intact.     Conjunctiva/sclera: Conjunctivae normal.     Pupils: Pupils are equal, round, and reactive to light.  Cardiovascular:     Rate and Rhythm: Normal rate and regular rhythm.     Heart sounds: No murmur heard. Pulmonary:     Effort: Pulmonary effort is normal. No respiratory distress.     Breath sounds: Normal breath sounds. No wheezing or rales.  Musculoskeletal:     Cervical back: Normal range of motion and neck supple.  Skin:    General: Skin is warm and dry.     Capillary Refill: Capillary refill takes less than 2 seconds.  Neurological:     General: No  focal deficit present.     Mental Status: She is alert and oriented to person, place, and time. Mental status is at baseline.  Psychiatric:        Mood and Affect: Mood normal.        Behavior: Behavior normal.        Thought Content: Thought content normal.        Judgment: Judgment normal.     Results for orders placed or performed in visit on 04/28/22  HgB A1c  Result Value Ref Range   Hgb A1c MFr Bld 6.5 (H) 4.8 - 5.6 %   Est. average glucose Bld gHb Est-mCnc 140 mg/dL  CBC with Differential/Platelet  Result Value Ref Range   WBC 8.3 3.4 - 10.8 x10E3/uL   RBC 5.03 3.77 - 5.28 x10E6/uL   Hemoglobin 14.3 11.1 - 15.9 g/dL   Hematocrit 42.7 34.0 - 46.6 %   MCV 85 79 - 97 fL   MCH 28.4 26.6 - 33.0 pg   MCHC 33.5 31.5 - 35.7 g/dL   RDW 12.8 11.7 - 15.4 %   Platelets 321 150 - 450 x10E3/uL   Neutrophils 55 Not Estab. %   Lymphs 37 Not Estab. %   Monocytes 7 Not Estab. %   Eos 1 Not Estab. %   Basos 0 Not Estab. %   Neutrophils Absolute 4.5 1.4 - 7.0 x10E3/uL   Lymphocytes Absolute 3.1 0.7 - 3.1 x10E3/uL   Monocytes Absolute 0.6 0.1 - 0.9 x10E3/uL   EOS (ABSOLUTE) 0.1 0.0 - 0.4 x10E3/uL   Basophils Absolute 0.0 0.0 - 0.2 x10E3/uL   Immature Granulocytes 0 Not Estab. %   Immature Grans (Abs) 0.0 0.0 - 0.1 x10E3/uL  Comprehensive metabolic panel  Result Value Ref Range   Glucose 96 70 - 99 mg/dL   BUN 19 6 - 24 mg/dL   Creatinine, Ser 0.88 0.57 - 1.00 mg/dL   eGFR 77 >59 mL/min/1.73   BUN/Creatinine Ratio 22 9 - 23   Sodium 141 134 - 144 mmol/L   Potassium 4.2 3.5 - 5.2 mmol/L   Chloride 101 96 - 106 mmol/L   CO2 25 20 - 29 mmol/L   Calcium 9.3 8.7 - 10.2 mg/dL   Total Protein 7.2 6.0 - 8.5 g/dL   Albumin 4.8 3.8 - 4.9 g/dL   Globulin, Total 2.4 1.5 - 4.5 g/dL   Albumin/Globulin Ratio 2.0 1.2 - 2.2   Bilirubin Total 0.8 0.0 - 1.2 mg/dL   Alkaline Phosphatase 72 44 - 121 IU/L  AST 25 0 - 40 IU/L   ALT 26 0 - 32 IU/L  Lipid panel  Result Value Ref Range    Cholesterol, Total 166 100 - 199 mg/dL   Triglycerides 152 (H) 0 - 149 mg/dL   HDL 47 >39 mg/dL   VLDL Cholesterol Cal 27 5 - 40 mg/dL   LDL Chol Calc (NIH) 92 0 - 99 mg/dL   Chol/HDL Ratio 3.5 0.0 - 4.4 ratio  TSH  Result Value Ref Range   TSH 0.839 0.450 - 4.500 uIU/mL  Urinalysis, Routine w reflex microscopic  Result Value Ref Range   Specific Gravity, UA 1.020 1.005 - 1.030   pH, UA 6.5 5.0 - 7.5   Color, UA Yellow Yellow   Appearance Ur Clear Clear   Leukocytes,UA Negative Negative   Protein,UA Negative Negative/Trace   Glucose, UA Negative Negative   Ketones, UA Trace (A) Negative   RBC, UA Negative Negative   Bilirubin, UA Negative Negative   Urobilinogen, Ur 0.2 0.2 - 1.0 mg/dL   Nitrite, UA Negative Negative   Microscopic Examination Comment   Vitamin D (25 hydroxy)  Result Value Ref Range   Vit D, 25-Hydroxy 55.4 30.0 - 100.0 ng/mL  Cytology - PAP  Result Value Ref Range   Adequacy      Satisfactory for evaluation; transformation zone component PRESENT.   Diagnosis      - Negative for intraepithelial lesion or malignancy (NILM)      Assessment & Plan:   Problem List Items Addressed This Visit       Endocrine   Hyperlipidemia associated with type 2 diabetes mellitus ([place])    Chronic.  Controlled.  Continue with current medication regimen of Crestor 5mg .  Refill sent today.  Labs ordered today.  Return to clinic in 6 months for reevaluation.  Call sooner if concerns arise.        Relevant Medications   lisinopril (ZESTRIL) 2.5 MG tablet   rosuvastatin (CRESTOR) 5 MG tablet   Type 2 diabetes mellitus with other specified complication ([facility]) - Primary    Chronic.  Last A1c 6.5%.  Did not tolerate Metformin.  If A1c is elevated will start ozempic.  Patient is agreeable to plan.  Side effects and benefits of medication discussed during visit today.  Follow up in 6 months.  Discussed eye exam with patient during visit today.  Call sooner if concerns arise.        Relevant Medications   lisinopril (ZESTRIL) 2.5 MG tablet   rosuvastatin (CRESTOR) 5 MG tablet   Other Relevant Orders   Comp Met (CMET)   HgB A1c   Microalbumin, Urine Waived     Other   Morbid obesity ([facility])    Recommended eating smaller high protein, low fat meals more frequently and exercising 30 mins a day 5 times a week with a goal of 10-15lb weight loss in the next 3 months.       Other Visit Diagnoses     Elevated cholesterol       Relevant Medications   lisinopril (ZESTRIL) 2.5 MG tablet   rosuvastatin (CRESTOR) 5 MG tablet   Screening for HIV (human immunodeficiency virus)       Relevant Orders   HIV Antibody (routine testing w rflx)   Encounter for hepatitis C screening test for low risk patient       Relevant Orders   Hepatitis C Antibody        Follow up plan: Return in about  6 months (around 02/06/2023) for HTN, HLD, DM2 FU.

## 2022-08-06 NOTE — Assessment & Plan Note (Signed)
Chronic.  Controlled.  Continue with current medication regimen of Crestor 5mg .  Refill sent today.  Labs ordered today.  Return to clinic in 6 months for reevaluation.  Call sooner if concerns arise.

## 2022-08-06 NOTE — Assessment & Plan Note (Signed)
Chronic.  Last A1c 6.5%.  Did not tolerate Metformin.  If A1c is elevated will start ozempic.  Patient is agreeable to plan.  Side effects and benefits of medication discussed during visit today.  Follow up in 6 months.  Discussed eye exam with patient during visit today.  Call sooner if concerns arise.

## 2022-08-06 NOTE — Assessment & Plan Note (Signed)
Recommended eating smaller high protein, low fat meals more frequently and exercising 30 mins a day 5 times a week with a goal of 10-15lb weight loss in the next 3 months.  

## 2022-08-07 LAB — COMPREHENSIVE METABOLIC PANEL
ALT: 21 IU/L (ref 0–32)
AST: 16 IU/L (ref 0–40)
Albumin/Globulin Ratio: 2 (ref 1.2–2.2)
Albumin: 4.6 g/dL (ref 3.8–4.9)
Alkaline Phosphatase: 65 IU/L (ref 44–121)
BUN/Creatinine Ratio: 14 (ref 9–23)
BUN: 13 mg/dL (ref 6–24)
Bilirubin Total: 1 mg/dL (ref 0.0–1.2)
CO2: 23 mmol/L (ref 20–29)
Calcium: 9.5 mg/dL (ref 8.7–10.2)
Chloride: 102 mmol/L (ref 96–106)
Creatinine, Ser: 0.9 mg/dL (ref 0.57–1.00)
Globulin, Total: 2.3 g/dL (ref 1.5–4.5)
Glucose: 101 mg/dL — ABNORMAL HIGH (ref 70–99)
Potassium: 4.4 mmol/L (ref 3.5–5.2)
Sodium: 142 mmol/L (ref 134–144)
Total Protein: 6.9 g/dL (ref 6.0–8.5)
eGFR: 75 mL/min/{1.73_m2} (ref >59)

## 2022-08-07 LAB — HEPATITIS C ANTIBODY: Hep C Virus Ab: NONREACTIVE

## 2022-08-07 LAB — HEMOGLOBIN A1C
Est. average glucose Bld gHb Est-mCnc: 146 mg/dL
Hgb A1c MFr Bld: 6.7 % — ABNORMAL HIGH (ref 4.8–5.6)

## 2022-08-07 LAB — HIV ANTIBODY (ROUTINE TESTING W REFLEX): HIV Screen 4th Generation wRfx: NONREACTIVE

## 2022-08-07 NOTE — Progress Notes (Signed)
Hi Mariah Mckay. It was nice to see you yesterday.  Your lab work looks good.  Your A1c did increase to 6.7%.  This is still considered well controlled.  However, If you want to try the Ozempic again we can do that.  Your kidneys do show that they are dumping some protein so staying on the Lisinopril is a good idea.   No other concerns at this time. Continue with your current medication regimen.  Follow up as discussed.  Please let me know if you have any questions.

## 2022-10-28 ENCOUNTER — Ambulatory Visit: Payer: Self-pay | Admitting: Nurse Practitioner

## 2023-02-09 ENCOUNTER — Ambulatory Visit: Payer: Managed Care, Other (non HMO) | Admitting: Nurse Practitioner

## 2023-04-01 ENCOUNTER — Encounter: Payer: Self-pay | Admitting: Nurse Practitioner

## 2023-04-02 ENCOUNTER — Other Ambulatory Visit: Payer: Self-pay | Admitting: Nurse Practitioner

## 2023-04-02 DIAGNOSIS — Z1231 Encounter for screening mammogram for malignant neoplasm of breast: Secondary | ICD-10-CM

## 2023-04-28 ENCOUNTER — Ambulatory Visit
Admission: RE | Admit: 2023-04-28 | Discharge: 2023-04-28 | Disposition: A | Discharge status: Home / Self Care | Payer: BC Managed Care – PPO | Source: Ambulatory Visit | Attending: Nurse Practitioner | Admitting: Nurse Practitioner

## 2023-04-28 DIAGNOSIS — Z1231 Encounter for screening mammogram for malignant neoplasm of breast: Secondary | ICD-10-CM | POA: Diagnosis present

## 2024-04-18 ENCOUNTER — Ambulatory Visit

## 2024-04-18 VITALS — BP 128/82 | HR 82 | Temp 97.9°F | Ht <= 58 in | Wt 203.0 lb

## 2024-04-18 DIAGNOSIS — K602 Anal fissure, unspecified: Secondary | ICD-10-CM | POA: Insufficient documentation

## 2024-04-18 DIAGNOSIS — N182 Chronic kidney disease, stage 2 (mild): Secondary | ICD-10-CM

## 2024-04-18 DIAGNOSIS — E559 Vitamin D deficiency, unspecified: Secondary | ICD-10-CM

## 2024-04-18 DIAGNOSIS — E1169 Type 2 diabetes mellitus with other specified complication: Secondary | ICD-10-CM

## 2024-04-18 DIAGNOSIS — G44209 Tension-type headache, unspecified, not intractable: Secondary | ICD-10-CM

## 2024-04-18 LAB — POCT GLYCOSYLATED HEMOGLOBIN (HGB A1C): Hemoglobin A1C: 6.6 % — AB (ref 4.0–5.6)

## 2024-04-18 MED ORDER — LISINOPRIL 2.5 MG PO TABS: 2.5000 mg | ORAL_TABLET | Freq: Every day | ORAL | 3 refills | Status: AC

## 2024-04-18 MED ORDER — ATORVASTATIN CALCIUM 20 MG PO TABS: 20.0000 mg | ORAL_TABLET | Freq: Every day | ORAL | 3 refills | Status: DC

## 2024-04-18 MED ORDER — SEMAGLUTIDE(0.25 OR 0.5MG/DOS) 2 MG/1.5ML ~~LOC~~ SOPN: 0.2500 mg | PEN_INJECTOR | SUBCUTANEOUS | 0 refills | Status: AC

## 2024-04-18 NOTE — Patient Instructions (Addendum)
 Thank you for visiting [facility] today! Here's what we talked about: - Set up diabetic eye exam - START Lisinopril  and Lipitor - Use neck massager and scalp massager for 10-15 mins daily

## 2024-04-18 NOTE — Progress Notes (Signed)
 Subjective:   This visit was conducted in person. The patient gave informed consent to the use of [employer] AI technology to record the contents of the encounter as documented below.   Patient ID: Mariah Mckay, female    DOB: March 04, 1966, 58 y.o.   MRN: [id number]   Mariah Mckay is a very pleasant 58 y.o. female who presents today as a new patient.  Past medical, surgical and family history: Reviewed and updated in chart.  Allergies: Reviewed and updated in chart.  Medications: Reviewed and updated in chart.  Social history: Reviewed and updated in chart.  Last PCP and reason for leaving: Pt preference  Discussed the use of AI scribe software for clinical note transcription with the patient, who gave verbal consent to proceed.  History of Present Illness Mariah Mckay is a 58 year old female with type 2 diabetes and high cholesterol who presents for a follow-up visit.  She has type 2 diabetes, diagnosed in December 2023 with an A1c of 6.5. Her A1c was 6.7 in March 2024 and is currently 6.6. She was previously on Ozempic  but switched to metformin  due to insurance issues, which she chose not to take due to concerns about the medication. She has not been on any diabetes medication recently.  She has a history of high cholesterol, previously controlled with medication, but she did not take her cholesterol medication consistently. She has a family history of early heart disease; her father had a stroke at 74 and subsequent heart attacks and strokes until his death at 66. She has had high cholesterol since her twenties.  She experiences ringing in her ears, described as a constant pressure-like sensation, similar to the aftermath of loud noise exposure, occurring for the past two weeks. No headaches, ear pain, or dizziness, but she has occasional vision changes, particularly when reading. She has a history of bilateral ear infections in 2016, with one eardrum having bled and burst,  leading to persistent issues with ear popping when swallowing.  She has a history of nasal congestion and bone spurs, leading to breathing difficulties. No sinus headaches, but recent congestion and coughing up mucus. She has a history of low vitamin D  but is not currently taking any supplements. Her vitamin D  levels were normal a year ago.  She has a history of an anal fissure, which becomes aggravated with frequent bathroom use and wiping. She uses a cream as needed for this condition. She has not been taking any medications regularly, expressing a general reluctance to take medications unless necessary. She started a new job in August 2025 and reports being short-staffed at work, which affects her ability to take time off for medical appointments.     Review of Systems  All other systems reviewed and are negative.       BP 128/82 (BP Location: Right Arm, Patient Position: Sitting, Cuff Size: Large)   Pulse 82   Temp 97.9 F (36.6 C) (Oral)   Ht 4' 10 (1.473 m)   Wt 203 lb (92.1 kg)   SpO2 96%   BMI 42.43 kg/m   Objective:     Physical Exam GENERAL: Alert, cooperative, well developed, no acute distress. HEAD: Normocephalic atraumatic. EYES: Extraocular movements intact bilaterally, pupils round, equal and reactive to light bilaterally, conjunctivae normal bilaterally. EARS: Tympanic membranes and external auditory canals normal bilaterally, no infection or redness. NOSE: No congestion or rhinorrhea, mucous membranes are moist. EXTREMITIES: No cyanosis or edema. NEUROLOGICAL: Oriented to person, place  and time, no gait abnormalities, moves all extremities without gross motor or sensory deficit.        Assessment & Plan:   New patient, past medical and social history thoroughly reviewed and updated in chart. 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 Early diabetic nephropathy with proteinuria.  Discussed GLP-1 receptor agonists benefits and side effects. Emphasized annual eye exams for diabetic retinopathy monitoring.  - Ordered Ozempic , attempted prior authorization with [employer]. - Consider alternatives like Trulicity, Victoza, Mounjaro, or Zepbound if Ozempic  not covered. - Ordered urine test for proteinuria. - Scheduled annual eye exam. - Prescribed low dose lisinopril  for renal protection, Bps generally run soft.  Hyperlipidemia associated with type 2 diabetes mellitus Hyperlipidemia with family history of cardiovascular events. Discussed cholesterol management importance and dietary modifications.  - Prescribed Lipitor 20 mg daily. - Ordered fasting lipid panel. - Encouraged Mediterranean diet.  Morbid obesity Contributing to diabetes and cardiovascular risk. Discussed weight loss benefits and potential aid from GLP-1 receptor agonists.  - Attempted to prescribe Ozempic  for weight management.  Stage 2 chronic kidney disease Proteinuria likely secondary to diabetes. Discussed lisinopril 's role in slowing kidney disease progression. - Prescribed lisinopril  for renal protection. - Ordered urine test to monitor proteinuria. - Will need yearly BMPs and urine microalbumin  Tension-type headache Episodic, Tension-type headache likely due to muscle tension. Conservative management preferred.  - Referred to osteopathic manipulation for muscle tension relief. - Advised daily use of neck and scalp massagers for 10-15 minutes. - Recommended Tylenol  for symptomatic relief if needed.  Vitamin D  deficiency Plan to reassess vitamin D  status. - Ordered fasting vitamin D  level.   Return in about 4 weeks (around 05/16/2024) for CPE, pls schedule fasting lab visit the same day.   Christen Bedoya K Tamieka Rancourt, MD  04/18/24    Contains text generated by [employer].

## 2024-04-19 ENCOUNTER — Ambulatory Visit: Payer: Self-pay

## 2024-04-20 ENCOUNTER — Other Ambulatory Visit (HOSPITAL_COMMUNITY): Payer: Self-pay

## 2024-04-20 ENCOUNTER — Telehealth: Payer: Self-pay

## 2024-04-20 NOTE — Telephone Encounter (Signed)
 Pharmacy Patient Advocate Encounter   Received notification from Onbase that prior authorization for Ozempic  2 is required/requested.   Insurance verification completed.   The patient is insured through [facility].   Per test claim: PA required; PA submitted to above mentioned insurance via Latent Key/confirmation #/EOC [id number] Status is pending

## 2024-04-20 NOTE — Telephone Encounter (Signed)
 Pharmacy Patient Advocate Encounter  Received notification from [facility] that Prior Authorization for Ozempic  2 has been APPROVED from 04/20/24 to 04/20/25. Ran test claim, Copay is $24.99. This test claim was processed through [facility]- copay amounts may vary at other pharmacies due to pharmacy/plan contracts, or as the patient moves through the different stages of their insurance plan.   PA #/Case ID/Reference #: # [id number]

## 2024-04-21 ENCOUNTER — Other Ambulatory Visit (HOSPITAL_COMMUNITY): Payer: Self-pay

## 2024-04-23 DIAGNOSIS — N182 Chronic kidney disease, stage 2 (mild): Secondary | ICD-10-CM | POA: Insufficient documentation

## 2024-05-29 ENCOUNTER — Other Ambulatory Visit

## 2024-05-29 ENCOUNTER — Other Ambulatory Visit: Payer: Self-pay

## 2024-05-29 DIAGNOSIS — Z1231 Encounter for screening mammogram for malignant neoplasm of breast: Secondary | ICD-10-CM

## 2024-05-29 DIAGNOSIS — N182 Chronic kidney disease, stage 2 (mild): Secondary | ICD-10-CM

## 2024-05-29 DIAGNOSIS — E1169 Type 2 diabetes mellitus with other specified complication: Secondary | ICD-10-CM | POA: Diagnosis not present

## 2024-05-29 DIAGNOSIS — E785 Hyperlipidemia, unspecified: Secondary | ICD-10-CM

## 2024-05-29 LAB — LIPID PANEL
Cholesterol: 226 mg/dL — ABNORMAL HIGH (ref 28–200)
HDL: 45.9 mg/dL
LDL Cholesterol: 150 mg/dL — ABNORMAL HIGH (ref 10–99)
NonHDL: 180.41
Total CHOL/HDL Ratio: 5
Triglycerides: 152 mg/dL — ABNORMAL HIGH (ref 10.0–149.0)
VLDL: 30.4 mg/dL (ref 0.0–40.0)

## 2024-05-29 LAB — MICROALBUMIN / CREATININE URINE RATIO
Creatinine,U: 92.3 mg/dL
Microalb Creat Ratio: UNDETERMINED mg/g (ref 0.0–30.0)
Microalb, Ur: <0.7 mg/dL

## 2024-05-29 LAB — BASIC METABOLIC PANEL WITH GFR
BUN: 13 mg/dL (ref 6–23)
CO2: 27 meq/L (ref 19–32)
Calcium: 9.3 mg/dL (ref 8.4–10.5)
Chloride: 105 meq/L (ref 96–112)
Creatinine, Ser: 0.78 mg/dL (ref 0.40–1.20)
GFR: 83.64 mL/min
Glucose, Bld: 108 mg/dL — ABNORMAL HIGH (ref 70–99)
Potassium: 4.4 meq/L (ref 3.5–5.1)
Sodium: 140 meq/L (ref 135–145)

## 2024-05-29 LAB — VITAMIN D 25 HYDROXY (VIT D DEFICIENCY, FRACTURES): VITD: 32.11 ng/mL (ref 30.00–100.00)

## 2024-06-13 ENCOUNTER — Ambulatory Visit

## 2024-06-13 VITALS — BP 118/84 | HR 88 | Temp 98.1°F | Ht <= 58 in | Wt 206.0 lb

## 2024-06-13 DIAGNOSIS — Z6841 Body Mass Index (BMI) 40.0 and over, adult: Secondary | ICD-10-CM

## 2024-06-13 DIAGNOSIS — Z0000 Encounter for general adult medical examination without abnormal findings: Secondary | ICD-10-CM | POA: Diagnosis not present

## 2024-06-13 DIAGNOSIS — Z1211 Encounter for screening for malignant neoplasm of colon: Secondary | ICD-10-CM

## 2024-06-13 DIAGNOSIS — Z803 Family history of malignant neoplasm of breast: Secondary | ICD-10-CM | POA: Insufficient documentation

## 2024-06-13 DIAGNOSIS — E7800 Pure hypercholesterolemia, unspecified: Secondary | ICD-10-CM | POA: Diagnosis not present

## 2024-06-13 NOTE — Patient Instructions (Addendum)
 Thank you for visiting [facility] today! Here's what we talked about: - START taking half a tablet of Atorvastatin  - Get shingles vaccine from pharmacy - Start Ozempic 

## 2024-06-13 NOTE — Progress Notes (Unsigned)
 Sees dentist regularly. Does not see GYN. Declines PAP today.

## 2024-06-14 NOTE — Assessment & Plan Note (Signed)
 Age-appropriate screening, counseling and vaccines discussed with patient. Healthy diet and exercise recommendations given. Discussed colonoscopy for colon cancer screening. Family history of breast cancer. Normal mammogram in 2023. Pap smear in 2023 without HPV testing. Discussed shingles vaccine importance. - Sent referral to [facility] for colonoscopy. - Scheduled well woman exam in August 2026, including Pap smear and breast exam. - Recommended shingles vaccine from pharmacy.  Colonoscopy: Cologuard in 2022 was neg, ordered Colonoscopy Mammogram: WNL in 2024, scheduled Pap: 2023, cytology WNL due Labs: PSA, HIV, Hep C, Lipid and A1c UTD Immunizations: Declined Flu, covid, shingles from pharm Diet and Exercise: Unhealthy snacks, large portions, sugary snacks, Walking on treadmill daily Sleep and mood: Okay Dental and vision:UTD Health counselling given: Healthy eating and diet given DM  Orders:   Amb Referral to Colonoscopy

## 2024-06-14 NOTE — Assessment & Plan Note (Signed)
 Discussed dietary habits and sugar intake challenges. Encouraged healthy snacking and portion control. Started treadmill walking.  She has not started the Ozempic  which was sent for her, counseled patient to start. - Encouraged healthy snacking options. - Continue treadmill walking. - Start Ozempic  0.25 mg weekly with plans to uptitrate

## 2024-06-26 ENCOUNTER — Encounter

## 2024-10-17 ENCOUNTER — Ambulatory Visit

## 2024-12-18 ENCOUNTER — Ambulatory Visit
